# Patient Record
Sex: Male | Born: 1935 | Race: White | Hispanic: No | Smoking: Never smoker
Health system: Southern US, Community
[De-identification: ages and names within clinical notes are randomized; demographics above are authoritative.]

## PROBLEM LIST (undated history)

## (undated) DIAGNOSIS — I25810 Atherosclerosis of coronary artery bypass graft(s) without angina pectoris: Secondary | ICD-10-CM

## (undated) DIAGNOSIS — E039 Hypothyroidism, unspecified: Secondary | ICD-10-CM

## (undated) DIAGNOSIS — E7800 Pure hypercholesterolemia, unspecified: Secondary | ICD-10-CM

## (undated) DIAGNOSIS — I639 Cerebral infarction, unspecified: Secondary | ICD-10-CM

## (undated) HISTORY — DX: Pure hypercholesterolemia, unspecified: E78.00

## (undated) HISTORY — PX: APPENDECTOMY: SHX54

## (undated) HISTORY — PX: CORONARY ARTERY BYPASS GRAFT: SHX141

## (undated) HISTORY — DX: Atherosclerosis of coronary artery bypass graft(s) without angina pectoris: I25.810

## (undated) HISTORY — DX: Hypothyroidism, unspecified: E03.9

## (undated) HISTORY — PX: CORONARY ANGIOPLASTY WITH STENT PLACEMENT: SHX49

---

## 1998-05-01 ENCOUNTER — Inpatient Hospital Stay (HOSPITAL_COMMUNITY): Admission: EM | Admit: 1998-05-01 | Discharge: 1998-05-05 | Payer: Self-pay | Admitting: Emergency Medicine

## 1998-06-02 ENCOUNTER — Encounter (HOSPITAL_COMMUNITY): Admission: RE | Admit: 1998-06-02 | Discharge: 1998-08-31 | Payer: Self-pay | Admitting: Cardiology

## 2003-05-21 ENCOUNTER — Inpatient Hospital Stay (HOSPITAL_COMMUNITY): Admission: AD | Admit: 2003-05-21 | Discharge: 2003-05-28 | Payer: Self-pay | Admitting: Cardiology

## 2004-01-07 ENCOUNTER — Ambulatory Visit: Payer: Self-pay | Admitting: Cardiology

## 2004-01-14 ENCOUNTER — Ambulatory Visit: Payer: Self-pay | Admitting: Cardiology

## 2004-01-29 ENCOUNTER — Ambulatory Visit: Payer: Self-pay | Admitting: Cardiology

## 2004-07-30 ENCOUNTER — Ambulatory Visit: Payer: Self-pay | Admitting: Cardiology

## 2004-09-16 ENCOUNTER — Ambulatory Visit: Payer: Self-pay | Admitting: Cardiology

## 2005-01-03 ENCOUNTER — Ambulatory Visit: Payer: Self-pay | Admitting: Internal Medicine

## 2005-01-26 ENCOUNTER — Ambulatory Visit: Payer: Self-pay | Admitting: Cardiology

## 2005-07-22 ENCOUNTER — Ambulatory Visit: Payer: Self-pay | Admitting: Cardiology

## 2006-01-18 ENCOUNTER — Ambulatory Visit: Payer: Self-pay | Admitting: Cardiology

## 2006-06-30 ENCOUNTER — Ambulatory Visit: Payer: Self-pay | Admitting: Cardiology

## 2006-06-30 LAB — CONVERTED CEMR LAB: TSH: 2.33 microintl units/mL (ref 0.35–5.50)

## 2007-01-12 ENCOUNTER — Ambulatory Visit: Payer: Self-pay | Admitting: Cardiology

## 2007-01-12 LAB — CONVERTED CEMR LAB
Albumin: 4.1 g/dL (ref 3.5–5.2)
Cholesterol: 130 mg/dL (ref 0–200)
LDL Cholesterol: 72 mg/dL (ref 0–99)
Total Bilirubin: 0.9 mg/dL (ref 0.3–1.2)
Total CHOL/HDL Ratio: 4.5
Total Protein: 7.1 g/dL (ref 6.0–8.3)
Triglycerides: 147 mg/dL (ref 0–149)

## 2007-07-30 ENCOUNTER — Ambulatory Visit: Payer: Self-pay | Admitting: Cardiology

## 2008-01-31 ENCOUNTER — Ambulatory Visit: Payer: Self-pay | Admitting: Cardiology

## 2008-02-26 ENCOUNTER — Ambulatory Visit: Payer: Self-pay | Admitting: Cardiology

## 2008-02-26 LAB — CONVERTED CEMR LAB
ALT: 22 units/L (ref 0–53)
AST: 25 units/L (ref 0–37)
Albumin: 3.9 g/dL (ref 3.5–5.2)
Alkaline Phosphatase: 60 units/L (ref 39–117)
Cholesterol: 131 mg/dL (ref 0–200)
Total Protein: 6.7 g/dL (ref 6.0–8.3)
Triglycerides: 140 mg/dL (ref 0–149)

## 2008-06-12 ENCOUNTER — Encounter (INDEPENDENT_AMBULATORY_CARE_PROVIDER_SITE_OTHER): Payer: Self-pay | Admitting: *Deleted

## 2008-07-31 DIAGNOSIS — E7800 Pure hypercholesterolemia, unspecified: Secondary | ICD-10-CM | POA: Insufficient documentation

## 2008-07-31 DIAGNOSIS — E039 Hypothyroidism, unspecified: Secondary | ICD-10-CM | POA: Insufficient documentation

## 2008-07-31 DIAGNOSIS — I25810 Atherosclerosis of coronary artery bypass graft(s) without angina pectoris: Secondary | ICD-10-CM

## 2008-08-01 ENCOUNTER — Ambulatory Visit: Payer: Self-pay | Admitting: Cardiology

## 2008-08-01 DIAGNOSIS — I440 Atrioventricular block, first degree: Secondary | ICD-10-CM

## 2008-10-21 ENCOUNTER — Encounter: Admission: RE | Admit: 2008-10-21 | Discharge: 2008-10-21 | Payer: Self-pay | Admitting: Internal Medicine

## 2009-01-27 ENCOUNTER — Encounter (INDEPENDENT_AMBULATORY_CARE_PROVIDER_SITE_OTHER): Payer: Self-pay | Admitting: *Deleted

## 2009-01-28 ENCOUNTER — Ambulatory Visit: Payer: Self-pay | Admitting: Cardiology

## 2009-07-29 ENCOUNTER — Ambulatory Visit: Payer: Self-pay | Admitting: Cardiology

## 2009-09-24 ENCOUNTER — Telehealth: Payer: Self-pay | Admitting: Cardiology

## 2009-11-03 ENCOUNTER — Telehealth (INDEPENDENT_AMBULATORY_CARE_PROVIDER_SITE_OTHER): Payer: Self-pay | Admitting: *Deleted

## 2010-02-01 ENCOUNTER — Encounter: Payer: Self-pay | Admitting: Cardiology

## 2010-02-01 ENCOUNTER — Ambulatory Visit: Payer: Self-pay | Admitting: Cardiology

## 2010-04-01 NOTE — Progress Notes (Signed)
Summary: Pt having cataract eye surgery need to stop aspirin  Phone Note Call from Patient Call back at [phone]   Caller: Daughter/Sandra Summary of Call: Pt having cataract surgery on 10/07/09 and need to stop Aspirin his week before surgery Initial call taken by: Judie Grieve,  September 24, 2009 3:45 PM  Follow-up for Phone Call        I spoke with Dr Riley Kill and the pt can hold his ASA 1 week  prior to cataract surgery.  The pt's daughter is aware.  Follow-up by: Julieta Gutting, RN, BSN,  September 24, 2009 7:04 PM

## 2010-04-01 NOTE — Assessment & Plan Note (Signed)
Summary: f68m   Visit Type:  6 months follow up Primary Provider:  Timothy Lasso  CC:  No complains.  History of Present Illness: Denies chest pain.  Stillgets out in the morning and sees his friend.  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  Denies any chest pain with exertion.   Current Medications (verified): 1)  Pravastatin Sodium 40 Mg Tabs (Pravastatin Sodium) .... Take 1 Tablet By Mouth Two Times A Day At Bed Time 2)  Aspirin 81 Mg Tbec (Aspirin) .... Take One Tablet By Mouth Daily 3)  Synthroid 75 Mcg Tabs (Levothyroxine Sodium) .... Take 1 Tablet By Mouth Once A Day 4)  Nitroglycerin 0.4 Mg Subl (Nitroglycerin) .... One Tablet Under Tongue Every 5 Minutes As Needed For Chest Pain---May Repeat Times Three 5)  Multivitamins  Tabs (Multiple Vitamin) .... Take 1 Tablet By Mouth Once A Day 6)  Namenda 5 Mg Tabs (Memantine Hcl) .... Take 1 Tablet By Mouth Once A Day  Allergies (verified): No Known Drug Allergies  Vital Signs:  Patient profile:   77 year old male Height:      72 inches Weight:      206 pounds BMI:     28.04 Pulse rate:   57 / minute Pulse rhythm:   regular Resp:     18 per minute BP sitting:   109 / 67  (left arm) Cuff size:   large  Vitals Entered By: Vikki Ports (July 29, 2009 3:52 PM)  Physical Exam  General:  Well developed, well nourished, in no acute distress. Head:  normocephalic and atraumatic Eyes:  PERRLA/EOM intact; conjunctiva and lids normal. Lungs:  Clear bilaterally to auscultation and percussion. Heart:  PMI non displaced.  Normal S1 and S2.  No definite murmur.  Abdomen:  Bowel sounds positive; abdomen soft and non-tender without masses, organomegaly, or hernias noted. No hepatosplenomegaly. Extremities:  No clubbing or cyanosis. Neurologic:  Alert and oriented x 3.   EKG  Procedure date:  07/29/2009  Findings:      NSR.  FIrst degree AV block.  PR  272  Impression & Recommendations:  Problem # 1:  CAD, ARTERY BYPASS GRAFT (ICD-414.04) Remains  stable from a cardiac standpoint.  No definite symptoms.  Able to do most things.  Encourage activity.  His updated medication list for this problem includes:    Aspirin 81 Mg Tbec (Aspirin) .Marland Kitchen... Take one tablet by mouth daily    Nitroglycerin 0.4 Mg Subl (Nitroglycerin) ..... One tablet under tongue every 5 minutes as needed for chest pain---may repeat times three  Orders: EKG w/ Interpretation (93000)  Problem # 2:  HYPERCHOLESTEROLEMIA (ICD-272.0) Managed by Dr. Timothy Lasso.  His updated medication list for this problem includes:    Pravastatin Sodium 40 Mg Tabs (Pravastatin sodium) .Marland Kitchen... Take 1 tablet by mouth two times a day at bed time  Problem # 3:  AV BLOCK, 1ST DEGREE (ICD-426.11) continuing to monitor at this point in time.   His updated medication list for this problem includes:    Aspirin 81 Mg Tbec (Aspirin) .Marland Kitchen... Take one tablet by mouth daily    Nitroglycerin 0.4 Mg Subl (Nitroglycerin) ..... One tablet under tongue every 5 minutes as needed for chest pain---may repeat times three  Orders: EKG w/ Interpretation (93000)  Problem # 4:  HYPOTHYROIDISM (ICD-244.9) Followed by Dr. Timothy Lasso. His updated medication list for this problem includes:    Synthroid 75 Mcg Tabs (Levothyroxine sodium) .Marland Kitchen... Take 1 tablet by mouth once a day  Patient Instructions: 1)  Your physician recommends that you continue on your current medications as directed. Please refer to the Current Medication list given to you today. 2)  Your physician wants you to follow-up in:  6 MONTHS. You will receive a reminder letter in the mail two months in advance. If you don't receive a letter, please call our office to schedule the follow-up appointment.

## 2010-04-01 NOTE — Progress Notes (Signed)
Summary: refill meds  Phone Note Call from Patient Call back at Home Phone [phone] Message from:  Patient on November 03, 2009 4:16 PM  Refills Requested: Medication #1:  NITROGLYCERIN 0.4 MG SUBL One tablet under tongue every 5 minutes as needed for chest pain---may repeat times three [facility] [address] .   Method Requested: Fax to Local Pharmacy Caller: Daughter- sandra budd Reason for Call: Talk to Nurse Initial call taken by: Lorne Skeens,  November 03, 2009 4:16 PM  Follow-up for Phone Call        Rx faxed to pharmacy. Vikki Ports  November 04, 2009 10:58 AM     Prescriptions: NITROGLYCERIN 0.4 MG SUBL (NITROGLYCERIN) One tablet under tongue every 5 minutes as needed for chest pain---may repeat times three  #25 x 2   Entered by:   Vikki Ports   Authorized by:   Ronaldo Miyamoto, MD, [facility]   Signed by:   Vikki Ports on 11/04/2009   Method used:   Faxed to ...       [facility]. [phone]* (retail)       [address]       [place], [place]  [address]       Ph: [phone]       Fax: [phone]   RxID:   [id number]

## 2010-04-01 NOTE — Assessment & Plan Note (Signed)
Summary: f34m   Visit Type:  6 months follow up Primary Provider:  Timothy Lasso  CC:  Some Sob.  History of Present Illness: Overall doing well.  Denies major symptoms.  For 77 yo, he feels quality of life is good.  He goes most days to [place] to meet with friends.  Has lived longer than anyone in his family.   No chest pain.  Does not do enough at present.  Admits to not walking or exercising on a regular basis.  I encouraged him to do so today.   Problems Prior to Update: 1)  Av Block, 1st Degree  (ICD-426.11) 2)  Cad, Artery Bypass Graft  (ICD-414.04) 3)  Hypercholesterolemia  (ICD-272.0) 4)  Hypothyroidism  (ICD-244.9)  Current Medications (verified): 1)  Pravastatin Sodium 80 Mg Tabs (Pravastatin Sodium) .... Take One Tablet By Mouth Daily At Bedtime 2)  Aspirin 81 Mg Tbec (Aspirin) .... Take One Tablet By Mouth Daily 3)  Synthroid 75 Mcg Tabs (Levothyroxine Sodium) .... Take 1 Tablet By Mouth Once A Day 4)  Nitroglycerin 0.4 Mg Subl (Nitroglycerin) .... One Tablet Under Tongue Every 5 Minutes As Needed For Chest Pain---May Repeat Times Three 5)  Multivitamins  Tabs (Multiple Vitamin) .... Take 1 Tablet By Mouth Once A Day 6)  Namenda 10 Mg Tabs (Memantine Hcl) .... Take 1 Tablet By Mouth Once A Day  Allergies (verified): No Known Drug Allergies  Vital Signs:  Patient profile:   77 year old male Height:      72 inches Weight:      205.50 pounds BMI:     27.97 Pulse rate:   64 / minute Pulse rhythm:   irregular Resp:     18 per minute BP sitting:   110 / 60  (left arm) Cuff size:   large  Vitals Entered By: Vikki Ports (February 01, 2010 10:40 AM)  Physical Exam  General:  Well developed, well nourished, in no acute distress. Head:  normocephalic and atraumatic Eyes:  PERRLA/EOM intact; conjunctiva and lids normal. Lungs:  Clear bilaterally to auscultation and percussion. Heart:  Non displaced PMI.  Normal S1 and S2.  No rub, or gallop.  Minimal SEM.  No DM.    Abdomen:  Bowel sounds positive; abdomen soft and non-tender without masses, organomegaly, or hernias noted. No hepatosplenomegaly. Pulses:  pulses normal in all 4 extremities Extremities:  No clubbing or cyanosis. Neurologic:  Alert and oriented x 3.   EKG  Procedure date:  02/01/2010  Findings:      NSR with first degree av block.  Non specific ST abnormality.  Impression & Recommendations:  Problem # 1:  CAD, ARTERY BYPASS GRAFT (ICD-414.04) Perfectly stable at present.   His updated medication list for this problem includes:    Aspirin 81 Mg Tbec (Aspirin) .Marland Kitchen... Take one tablet by mouth daily    Nitroglycerin 0.4 Mg Subl (Nitroglycerin) ..... One tablet under tongue every 5 minutes as needed for chest pain---may repeat times three  Orders: EKG w/ Interpretation (93000)  Problem # 2:  HYPERCHOLESTEROLEMIA (ICD-272.0) Stable at present.  Has labs with Dr. Timothy Lasso on regular basis. His updated medication list for this problem includes:    Pravastatin Sodium 80 Mg Tabs (Pravastatin sodium) .Marland Kitchen... Take one tablet by mouth daily at bedtime  Problem # 3:  AV BLOCK, 1ST DEGREE (ICD-426.11) will need followup to monitor change.  His updated medication list for this problem includes:    Aspirin 81 Mg Tbec (Aspirin) .Marland Kitchen... Take  one tablet by mouth daily    Nitroglycerin 0.4 Mg Subl (Nitroglycerin) ..... One tablet under tongue every 5 minutes as needed for chest pain---may repeat times three  Patient Instructions: 1)  Your physician recommends that you schedule a follow-up appointment in: 6 months 2)  Your physician recommends that you continue on your current medications as directed. Please refer to the Current Medication list given to you today.

## 2010-06-09 ENCOUNTER — Other Ambulatory Visit: Payer: Self-pay | Admitting: Cardiology

## 2010-07-13 NOTE — Assessment & Plan Note (Signed)
[facility]                            CARDIOLOGY OFFICE NOTE   Vicky, Mccanless AVENIR LOZINSKI                          MRN:          [id number]  DATE:07/30/2007                            DOB:          March 09, 1921    Mr. Philip Herrera is in for followup.  In general he is doing pretty well.  [name] continues to do poorly, and this has occupied a bit of his time  obviously.  He denies any chest pain or progressive shortness of breath.   CURRENT MEDICATIONS:  1. Enteric coated aspirin 81 mg daily.  2. Multivitamin daily.  3. Pravachol 80 mg night.  4. Synthroid 0.05 mg daily.   On physical, he is alert and oriented, in no distress.  Blood pressure  is 110/64 and the pulse is 58.  The lungs fields are clear.  The cardiac rhythm is regular without a  significant murmur noted.  EXTREMITIES:  Reveal no definite edema.   The electrocardiogram demonstrates normal sinus rhythm with a first  degree AV block with a PR interval of 234 milliseconds.   IMPRESSION:  1. Coronary artery disease status post coronary artery bypass graft      surgery.  2. Hypercholesterolemia with relatively good numbers on Pravachol 80.  3  Hypothyroidism on replacement with last Synthroid being normal.  Of  note, apparently he intermittently takes this medication, and this has  been followed up with Dr. Timothy Lasso.   PLAN:  1. Return to clinic in six months.  2. Continue current medical regime.     Philip Herrera. Riley Kill, MD, [facility]  Electronically Signed    TDS/MedQ  DD: 07/30/2007  DT: 07/30/2007  Job #: [id number]   cc:   Gwen Pounds, MD

## 2010-07-13 NOTE — Assessment & Plan Note (Signed)
[facility]                            CARDIOLOGY OFFICE NOTE   Bence, Trapp ANASTACIO BUA                          MRN:          [id number]  DATE:01/12/2007                            DOB:          1922-01-22    Mr. Shorten is in for follow up. He has been struggling somewhat with his  wife. His wife has generally been fairly sick and increasingly appears  to have dementia. She has also probably had a recent stroke. He does get  a little bit light headed when he does stand up. Otherwise, he has  gotten along reasonably well.   CURRENT MEDICATIONS:  1. Enteric coated aspirin 81 mg daily.  2. Multivitamin daily.  3. Pravachol 80 mg nightly.  4. Synthroid 0.05 mg daily.   TSH done in May of this year was 2.58. Lipid profile reveals normal  liver functions. The total cholesterol is 130 with an HDL of 28 and an  LDL of 72.   PHYSICAL EXAMINATION:  VITAL SIGNS:  Blood pressure 110/70 supine and  then 110/70 sitting. The pulse was 68 and regular.  LUNGS:  Fields were really quite clear.  CARDIAC:  There is an S4 gallop. The cardiac rhythm is otherwise regular  without rub.  ABDOMEN:  Soft.   IMPRESSION:  1. Coronary artery disease status post coronary artery bypass graft      surgery.  2. Mild bradycardia.  3. Hypercholesterolemia on lipid lowering therapy.  4. Hypothyroidism on replacement therapy.   PLAN:  1. Return to clinic in six months.  2. Continue current medical regimen.  3. Continue general follow up with Dr. Timothy Lasso.     Arturo Morton. Riley Kill, MD, [facility]  Electronically Signed    TDS/MedQ  DD: 01/19/2007  DT: 01/20/2007  Job #: [id number]

## 2010-07-13 NOTE — Assessment & Plan Note (Signed)
[facility]                            CARDIOLOGY OFFICE NOTE   NAME:Herrera, Philip THWAITES                          MRN:          [id number]  DATE:01/31/2008                            DOB:          [birth year]    Philip Herrera is in for followup from a clinical standpoint.  He is stable.  He has not been having any ongoing chest pain.  He feels well.  He is  now on Aricept and the doses recently had been increased.  He has not  had presyncope or syncope.   MEDICATIONS:  1. Enteric-coated aspirin 81 mg daily.  2. Multivitamin daily.  3. Pravachol 80 mg nightly.  4. Synthroid 75 mcg daily.  5. Aricept 10 mg nightly.   PHYSICAL EXAMINATION:  VITAL SIGNS:  Blood pressure is 140/64 and pulse  59.  LUNGS:  Lung fields are clear.  CARDIAC:  Rhythm is regular.   EKG reveals sinus bradycardia with first-degree AV block, PR interval  234 milliseconds.   IMPRESSION:  1. Well-known coronary artery disease status post coronary artery      bypass graft surgery.  2. Hypercholesterolemia.  3. Lipid-lowering therapy  4. Recent increase in Aricept.   RECOMMENDATIONS:  1. Continue followup with Dr. Timothy Lasso.  2. Followup Cardiology in 6 months.  3. The patient's bradycardia will need to be monitored in light of      increasing Aricept.     Arturo Morton. Riley Kill, MD, [facility]  Electronically Signed    TDS/MedQ  DD: 01/31/2008  DT: 02/01/2008  Job #: [id number]   cc:   Gwen Pounds, MD

## 2010-07-16 NOTE — Op Note (Signed)
NAME:  Philip Herrera, Philip Herrera NO.:  [id number]   MEDICAL RECORD NO.:  [id number]                   PATIENT TYPE:  INP   LOCATION:  2306                                 FACILITY:  [facility]   PHYSICIAN:  Mikey Bussing, M.D.           DATE OF BIRTH:  06-Jul-1921   DATE OF PROCEDURE:  DATE OF DISCHARGE:                                 OPERATIVE REPORT   NO DICTATION FOR THIS JOB.                                               Mikey Bussing, M.D.    PV/MEDQ  D:  05/22/2003  T:  05/23/2003  Job:  [id number]

## 2010-07-16 NOTE — Cardiovascular Report (Signed)
NAME:  Philip Herrera, Philip Herrera NO.:  [id number]   MEDICAL RECORD NO.:  [id number]                   PATIENT TYPE:  INP   LOCATION:  2306                                 FACILITY:  [facility]   PHYSICIAN:  Mikey Bussing, M.D.           DATE OF BIRTH:  Aug 19, 1921   DATE OF PROCEDURE:  DATE OF DISCHARGE:                              CARDIAC CATHETERIZATION   Audio too short to transcribe (less than 5 seconds)                                               Mikey Bussing, M.D.    PV/MEDQ  D:  05/22/2003  T:  05/22/2003  Job:  [id number]

## 2010-07-16 NOTE — Letter (Signed)
January 18, 2006    Janae Bridgeman. Lendell Caprice, M.D.  [address]  [place],  [place] [address]   RE:  Philip Herrera, Philip Herrera  MRN:  [id number]  /  DOB:  December 31, 1921   Dear Rosalia Hammers:   I had the pleasure of seeing Philip Herrera in the office today at a follow-  up visit. Overall, he physically looks okay. His daughter says that he  has been somewhat fatigued and a little bit short of breath, although he  has been doing absolutely no exercise. He has been under a lot of  stress, particularly with his daughter. He also takes care of his fairly  invalid wife, and she is also a patient of yours. They do have some help  for about 3 hours in the morning. The patient denies any ongoing chest  pain or major cardiac complaints other than his shortness of breath.   On physical today, the weight is 204 pounds, blood pressure is 134/86,  pulse is 61. In general, he appears slightly pale. The lung fields are  clear to auscultation and percussion. There is a soft systolic ejection  murmur. Abdomen is soft. There are good pulses distally without any  evidence of significant peripheral vascular disease by exam.   The electrocardiogram demonstrates normal sinus rhythm with first-degree  AV block and otherwise normal tracing.   This very nice gentleman is stable. He has had prior revascularization  surgery in 2005.  He is currently on Pravachol 80 mg, and he needs to  have a lipid and liver profile. I think he also would benefit from some  laboratory studies, and it has been over a year since he has had a  follow-up physical with you. I would strongly encourage this and I have  asked him to do this so  that he can get these things checked. I would be happy to see him at any  time. I have encouraged him to walk on a regular basis, and if his  symptoms of mild shortness of breath do not improve with this then we  could reassess his overall condition. I appreciate the opportunity of  sharing in his care.    Sincerely,      Arturo Morton. Riley Kill, MD, [facility]  Electronically Signed    TDS/MedQ  DD: 01/18/2006  DT: 01/18/2006  Job #: [id number]

## 2010-07-16 NOTE — Discharge Summary (Signed)
NAME:  Philip Herrera NO.:  [id number]   MEDICAL RECORD NO.:  [id number]                   PATIENT TYPE:  INP   LOCATION:  2007                                 FACILITY:  [facility]   PHYSICIAN:  Kerin Perna, M.D.               DATE OF BIRTH:  [birth year]   DATE OF ADMISSION:  DATE OF DISCHARGE:  05/28/2003                                 DISCHARGE SUMMARY   ADMISSION DIAGNOSES:  Coronary artery disease with recurrent, substernal  chest pain.   PAST MEDICAL HISTORY:  Coronary artery disease, status post MI 1988 followed  by angioplasty, MI 2000, status post PTCA and stenting.  Hyperlipidemia.   PAST SURGICAL HISTORY:  Appendectomy.   ALLERGIES:  NO KNOWN DRUG ALLERGIES.   DISCHARGE DIAGNOSES:  Two vessel coronary artery disease with class 4  angina, status post coronary artery bypass graft.   BRIEF HISTORY:  Mr. Philip Herrera is a 77 year old Caucasian male.  He is a patient  of Dr. Rosalyn Charters who has been following him for several years.  He had been  doing well with medical management of his coronary disease but recently  developed recurrent, substernal chest pain.  Dr. Riley Kill recommended repeat  cardiac catheterization.  [name] agreed with this plan and was scheduled  for routine admission.   HOSPITAL COURSE:  On 05/20/03, Philip Herrera was admitted to [facility]  under the care of Dr. Shawnie Pons. He underwent a cardiac  catheterization.  This revealed 2 vessel coronary artery disease including  severe circumflex disease with long lesions.  As his lesions were not  amenable to further PCI, cardiac surgery consultation was requested. He was  evaluated later in the day by Dr. Kathlee Nations Trigt.  After examination of the  patient and review of the old records, Dr. Donata Clay agreed that proceeding  with coronary artery bypass grafting was the appropriate course of treatment  for this gentleman.  The procedure, risks and benefits were all discussed  with Mr. Needle.  He agreed to proceed with surgery.   Preoperative arterial evaluation performed on March 23rd included a carotid  duplex exam which revealed no significant coronary artery disease.  His  upper extremities pain included bilateral Allen's tests; these were normal.  His lower extremities exam revealed palpable pedal pulses bilaterally.   On May 22, 2003, Philip Herrera underwent the following surgical procedure with  Dr. Kathlee Nations Trigt:  Coronary artery bypass grafting x4.  Grafts placed at  the time of procedure:  Left internal mammary artery grafted to the left  anterior descending artery, saphenous vein graft to the diagonal artery,  saphenous vein graft to the circumflex artery, saphenous vein graft to the  obtuse marginal artery. Vein was harvested from the right thigh via the Endo-  vein harvesting technique, through the mid-calf with an open surgical  technique.  [name] tolerated his  procedure well, transferring in stable  condition to the SICU.   He remained hemodynamically stable in the immediate postoperative period. He  was maintained in normal sinus rhythm on IV amiodarone.  He was extubated  several hours after arrival in the intensive care unit and awoke from  anesthesia, neurologically intact.  His stay in the intensive care unit was  uneventful. He was transferred out to unit 2000 on postoperative day #2.  His amiodarone was changed to p.o. on postoperative day #1.   Philip Herrera has continued to make very good progress in recovering from his  surgery.  His course has remained uneventful.  This morning, May 28, 2003,  postoperative day  #6, his vital signs were stable, blood pressure 125/67, afebrile, room air  saturation 93%.  His heart is maintained in normal sinus rhythm, 68 beats  per minute. Lungs were clear to auscultation.  He was tolerating his diet,  his bowel and bladder functions were within normal limits for him.  His  incisions were all healing  well. He has no lower extremities edema.  He is  ambulating frequently in the hallway.  His pain is well-controlled.  He did  have some hypokalemia yesterday. This was supplemented this morning with  potassium of 3.5. He will be given additional dose of potassium today.  He  is finished with his course of Lasix.  Philip Herrera is ready for discharge home  today.  His discharge instructions were reviewed with him.   Home health services have been arranged with [facility] for  registered nurse assistance.   LABORATORY DATA:  On March 27: CBC:  WBC 13.8, hemoglobin 9.6, hematocrit  28.1, platelets 151.  Chemistries on  March 30:  Sodium 138, potassium 3.5,  BUN 19, creatinine 1.1, glucose 110.   CONDITION ON DISCHARGE:  Improved.   DISCHARGE INSTRUCTIONS:   DISCHARGE MEDICATIONS:  1. He has been instructed to resume his home medications of Pravachol 40 mg     q.h.s., continue aspirin at new dose of 325 mg daily.  New medications:  1. Toprol  XL 25 mg daily.  2. Amiodarone 200 mg twice daily.  (It is expected he will not need     amiodarone long-term).  3. For pain management, he will have Ultram 50 mg 1-2 p.o. q.4h. p.r.n. for     moderate to severe pain or Tylenol 325 mg 1-2 p.o. q.4h. p.r.n. for mild     pain.   ACTIVITY:  He was asked to refrain from any driving or any heavy lifting  until next appointment. Also instructed to continue his breathing exercises  and daily walking.   WOUND CARE:  He is to shower daily.  If his incision shows any sign of  infection or fever greater than 100.1 degrees Fahrenheit, he is to call Dr.  Zenaida Niece Trigt's office.   FOLLOW UP:  Appointment with Dr. Riley Kill was to be arranged prior to  discharge today. He should be seen in Dr Rosalyn Charters office in approximately 2  weeks with a chest x-ray.  [name] has an appointment at the [facility] office to  see Dr. Dorris Fetch on Monday, April 25th at 11 a.m.     Toribio Harbour, N.P.                   Kerin Perna, M.D.    CTK/MEDQ  D:  05/28/2003  T:  05/29/2003  Job:  [id number]   cc:   Maisie Fus  Cheri Kearns, M.D. [facility]   Raymond C. Eloise Harman., M.D.  [address]  [place]  [place] [address]  Fax: [phone]

## 2010-07-16 NOTE — Cardiovascular Report (Signed)
NAME:  Philip Herrera, Philip Herrera                             ACCOUNT NO.:  [id number]   MEDICAL RECORD NO.:  [id number]                   PATIENT TYPE:  INP   LOCATION:  2399                                 FACILITY:  [facility]   PHYSICIAN:  Arturo Morton. Riley Kill, M.D. [facility]         DATE OF BIRTH:  1921/11/29   DATE OF PROCEDURE:  05/20/2003  DATE OF DISCHARGE:                              CARDIAC CATHETERIZATION   INDICATIONS:  Mr. Maneri is a delightful 77 year old gentleman who has had  known coronary disease.  He had an acute MI angioplasty in 1988 of the  circumflex.  He subsequently underwent stenting of the right coronary artery  in the setting of a myocardial infarction in 2000.  He now presents with  recurrent angina and a positive Cardiolite study.  Risks, benefits and  alternatives were discussed with the patient in detail and he was agreeable  to proceed.   PROCEDURES:  1. Left heart catheterization.  2. Selective coronary arteriography.  3. Selective left ventriculography.  4. Subclavian angiography.   DESCRIPTION OF PROCEDURE:  The patient was brought to the catheterization  lab and prepped and draped in the usual fashion.  Through an anterior  puncture, the right femoral artery was easily entered.  Views of the left  and right coronary arteries were obtained in multiple angiographic  projections, ventriculographies were performed in the RAO projection.  Using  the right coronary catheter the subclavian was injected.  All catheters were  subsequently removed.  He was taken to the holding area in satisfactory,  clinical condition.   HEMODYNAMIC DATA:  1. Central aortic pressure:  142/78 with a mean of 107.  2. Left ventricular pressure:  142/12.  3. No gradient on pullback across the aortic valve.   ANGIOGRAPHIC DATA:  1. Ventriculography was performed in the RAO projection.  There was some     hypokinesis of the inferobasal segment.  Overall ejection fraction,     however, appeared to  exceed 55%.  2. There is 30% narrowing in the proximal right coronary artery.  After this     there is a long stent with less than 20 to 30% narrowing throughout the     stent with a widely patent vessel.  There is a small posterolateral     branch that has about a 90% area of focal stenosis, but this is     relatively small.  3. The left coronary artery is moderately calcified.  4. The left main coronary is without critical narrowing.  5. The left anterior descending artery is moderately calcified and there is     about 70 to 75% focal narrowing in the proximal vessel.  There is a first     diagonal that has about 90% narrowing and a second diagonal with about     70%.  Between the origins of the diagonal was a 40 to 50% area of focal  narrowing as well.  The distal LAD wraps the apex.  6. The circumflex has about 40% ostial narrowing and then breaks into a     large marginal branch that bifurcates.  This marginal branch has 95%     segmental narrowing in its mid portion.  The A-V circumflex supplies two     posterolateral branches and there is 70 to 80% narrowing just after its     takeoff from the first marginal and then a 90% area of narrowing leading     into the first posterolateral branch.  7. The subclavian is widely patent as is the internal mammary.   CONCLUSIONS:  1. Preserved overall left ventricular function.  2. Patent internal mammary.  3. Continued patency of the stent to the right coronary.  4. High grade disease of both the circumflex marginal and the A-V     circumflex.  5. Moderately high grade disease of the left anterior descending artery.   DISPOSITION:  The patient has advanced age.  He has a number of options.  The best option is unclear.  I will sit and discuss the options with him in  great detail prior to making a final decision.  He would be a candidate from  the standpoint of revascularization surgery, but he may choose not to do  this.  We could  dilate the circumflex and place a stent in this vessel and  leave the A-V circumflex without intervention or an attempt could be made to  open this as well.  I will discuss these options with him in detail.                                               Arturo Morton. Riley Kill, M.D. [facility]    TDS/MEDQ  D:  05/20/2003  T:  05/22/2003  Job:  [id number]   cc:   Arturo Morton. Riley Kill, M.D. [facility]   CV Laboratory

## 2010-07-16 NOTE — Assessment & Plan Note (Signed)
[facility]                            CARDIOLOGY OFFICE NOTE   French, Kendra MELO STAUBER                          MRN:          [id number]  DATE:06/30/2006                            DOB:          1921-05-17    Mr. Leaf is in for a followup visit.  Cardiac-wise he is doing  reasonably well.  He is out 5 years from his revascularization surgery.  He is not having chest pain.  The biggest limitation is some orthostatic  dizziness.  When he stands up, he gets light-headed.  It rarely occurs a  lot.  He drinks a fair amount of coffee and does not drink much water.  His blood pressures have not been all that high.  He also has lost his  primary care doctor with Dr. Lendell Caprice retiring.  His daughter has seen  Dr. Creola Corn, and we have suggested he consider seeing Dr. Timothy Lasso for  his care.   MEDICATIONS:  1. Enteric-coated aspirin 81 mg daily.  2. Multivitamin daily.  3. Pravachol 80 mg nightly.  4. Synthroid 0.05 mg daily.   PHYSICAL:  The weight is 204 pounds, blood pressure 116/69, pulse 62.  LUNGS:  Fields are clear.  CARDIAC:  Rhythm is regular.  There is no murmur or gallop.  There are  no carotid bruits.  I did orthostatic blood pressures.  At rest, the  blood pressure was 120/70.  Standing, after several minutes it was also  120/70, so there was not a profound drop.   EKG reveals sinus rhythm with first degree AV block.   IMPRESSION:  1. Coronary artery disease status post coronary artery bypass grafting      surgery.  2. Hypothyroidism.  3. Hypertension.  4. Hypothyroidism.   PLAN:  1. Check TSH.  2. Encourage the patient to see Dr. Timothy Lasso.  3. Encourage increased water and perhaps slight salt intake.  4. Return to clinic in 6 months.     Arturo Morton. Riley Kill, MD, [facility]  Electronically Signed    TDS/MedQ  DD: 06/30/2006  DT: 06/30/2006  Job #: [id number]

## 2010-07-16 NOTE — Op Note (Signed)
NAME:  MALACHI, KINZLER                             ACCOUNT NO.:  [id number]   MEDICAL RECORD NO.:  [id number]                   PATIENT TYPE:  INP   LOCATION:  2306                                 FACILITY:  [facility]   PHYSICIAN:  Kerin Perna III, M.D.           DATE OF BIRTH:  01-Oct-1921   DATE OF PROCEDURE:  05/22/2003  DATE OF DISCHARGE:                                 OPERATIVE REPORT   OPERATION:  Coronary artery bypass grafting x 4 (left internal mammary  artery to the left anterior descending, saphenous vein graft to diagonal,  saphenous vein graft to first obtuse marginal, saphenous vein graft to  distal circumflex).   PREOPERATIVE DIAGNOSIS:  Class IV angina with unstable angina and three  vessel coronary disease.   POSTOPERATIVE DIAGNOSIS:  Class IV angina with unstable angina and three  vessel coronary disease.   SURGEON:  Kerin Perna, M.D.   ASSISTANT:  Toribio Harbour, N.P.   ANESTHESIA:  General.   INDICATIONS FOR PROCEDURE:  The patient is a 77 year old male with a history  of coronary disease status post stenting of the right coronary four years  ago.  He presented with symptoms of unstable angina and cardiac  catheterization demonstrated a high grade stenosis of his circumflex, high  grade stenosis of the LAD diagonal, and a patent stent in the proximal right  coronary.  His ejection fraction was 45%.  He was felt to be a candidate for  surgical revascularization based on his anatomy, symptoms, and a positive  stress test.   Prior to surgery, I examined the patient in his hospital room and reviewed  the results of the cardiac cath with the patient and family.  I discussed  the indications and expected benefits of coronary bypass surgery for  treatment of his coronary disease.  I discussed reviewed with the patient  the alternatives to surgical therapy and the expected outcome of those  alternative therapies.  I discussed with the patient and family the  major  aspects of the proposed procedure including the choices of conduits for  grafting, the location of the surgical incisions, the use of general  anesthesia and cardiopulmonary bypass, and the expected postoperative  hospital recovery.  I discussed with the patient the risks to him of  coronary bypass surgery including the risks of MI, CVA, bleeding, infection,  and death.  He understood these implications for the surgery and agreed to  proceed with the operation as planned under what I felt was an informed  consent.   OPERATIVE FINDINGS:  There was some mild to moderate scarring of the lateral  wall.  The saphenous vein graft was harvested from the right leg and was of  adequate quality.  Endoscopic technique was used.  The left IMA was a good  vessel with excellent flow.  The LAD was deeply intramyocardial and the  anastomosis was placed close  to the apex where the vessel was still a good  size vessel.  The patient had intramyocardial circumflex vessels, as well.   PROCEDURE:  The patient was brought to the operating room and placed supine  on the operating table.  General endotracheal anesthesia was induced under  invasive hemodynamic monitoring.  The chest, abdomen, and legs were prepped  with Betadine and draped as a sterile field.  A sternal incision was made as  the saphenous vein was harvested from the right leg using endoscopic  technique.  The left IMA was harvested as a pedicle graft at its origin of  the subclavian vessels.  It was a good vessel with excellent flow.  Heparin  was administered and the ACT was documented as being therapeutic.  The  sternal retractor was placed.  Purse-strings were placed in the ascending  aorta and right atrium and the patient was cannulated and placed on bypass.  The coronaries were identified for grafting.  The mammary artery and vein  grafts were prepared for the distal anastomoses.  A cardioplegia cannula was  placed in the ascending  aorta.  The patient was cooled to 30 degrees.  The  aortic Cross clamp was applied.  800 mL of cold blood cardioplegia was  delivered to the aortic root with good cardioplegic arrest and septal  temperature less than 12 degrees.  Topical iced saline slush was used to  augment myocardial preservation and a pericardial insufflator pad was used  to protect the left phrenic nerve.   The distal coronary anastomoses were then performed.  The first distal  anastomosis was to the diagonal.  This was a 1.5 mm vessel.  It had a  proximal 95% stenosis.  A reversed saphenous vein was sewn end-to-side with  running 7-0 Prolene with good flow through the graft.  The second distal  anastomosis was to the OM1.  This was a large 1.8 mm vessel with a proximal  99% stenosis.  A reversed saphenous vein was sewn end-to-side with running 7-  0 Prolene with good flow to the graft.  The cardioplegia was redosed.  The  third distal distal anastomosis was to the distal circumflex.  This was a  1.5 mm vessel with a proximal 95% stenosis.  A reversed saphenous vein was  sewn end-to-side with running 7-0 Prolene, there was good flow to the graft.  The fourth distal anastomosis was to the distal LAD.  This was  a 1.5 mm  vessel.  The left internal mammary artery pedicle was brought through an  opening created in the left lateral pericardium and was brought down onto  the LAD and sewn end-to-side with running 8-0 Prolene.  There was good flow  through the anastomosis with immediate rise in septal temperature after  release of the pedicle clamp of the mammary artery.  The mammary pedicle was  secured to the epicardium and the aortic Cross clamp was removed.   The heart was cardioverted back to a regular rhythm. Using a partial  occlusion clamp, three proximal vein anastomoses were placed on the aorta  using a 4.0 mm punch.  Running 6-0 Prolene was used for the vein grafts. The Cross clamp was removed and the vein grafts  were perfused.  Each had  good flow and hemostasis was documented at the proximal and distal sites.  The patient was rewarmed and reperfused.  Temporary pacing wires were  applied.  The lungs were expanded and the ventilator resumed.  The patient  was weaned from bypass without inotrops with stable blood pressure and  hemodynamics.  Protamine was administered without adverse reaction.  The  cannulae were removed.  The mediastinum was irrigated with warm antibiotic  irrigation.  The leg incisions were irrigated and closed in a standard  fashion.  The pericardium was loosely reapproximated.  Two mediastinal and a  left pleural chest tube were placed and brought out through separate  incisions.  The sternum was reapproximated with interrupted steel wire.  The  pectoralis fascia was closed with a running Vicryl.  The subcutaneous tissue  and skin layers were closed with running Vicryl and sterile dressings were  applied.  Total bypass time was 140 minutes.  The aortic Cross clamp time  was 60 minutes.                                               Mikey Bussing, M.D.    PV/MEDQ  D:  05/22/2003  T:  05/23/2003  Job:  [id number]

## 2010-07-16 NOTE — H&P (Signed)
NAME:  Philip Herrera, Philip Herrera                             ACCOUNT NO.:  [id number]   MEDICAL RECORD NO.:  [id number]                   PATIENT TYPE:  OIB   LOCATION:  2013                                 FACILITY:  [facility]   PHYSICIAN:  Arturo Morton. Riley Kill, M.D. [facility]         DATE OF BIRTH:  01-30-1922   DATE OF ADMISSION:  05/20/2003  DATE OF DISCHARGE:                                HISTORY & PHYSICAL   CHIEF COMPLAINT:  Chest tightness.   HISTORY OF PRESENT ILLNESS:  Philip Herrera is a 77 year old gentleman well known  to me.  He has had a prior acute MI angioplasty of the circumflex in 1988  with reduction in the stenosis from 100 to 20%.  He was treated medically at  that time and did well until 2000 at which time he presented with a  recurrent myocardial infarction.  The patient underwent a left heart  catheterization which revealed a 99% RCA with thrombus with 40% proximal  LAD, focal 60% LAD, 40% large circumflex, and EF of 55%.  He underwent PTCA  and stenting by Dr. Veneda Melter with ReoPro and Plavix.  He did well.  At  that time, his peak CK was 660 with an MB of 25, and an MB subsequently of  33 and 42.  He has continued to do well but recently developed recurrent  substernal chest pain.  The patient is brought back in for cardiac  catheterization.   The patient has no known drug allergies.   His current medications include:  1. Enteric-coated aspirin 81 mg daily.  2. Pravachol 40 mg q.h.s.   The patient underwent stress exercise radionuclide imaging at the [facility] on May 15, 2003.  This revealed a lateral defect.  He had  onset of chest tightness with exertion, and the test was terminated at  fatigue with a heart rate of 129.  There was ST segment depression of  approximately 2 mm compatible with a positive electrocardiographic  abnormality to stress.   The patient has had a prior appendectomy.   The patient is married.  His wife has been a patient of mine.  She has  been  increasingly incapacitated, and he is providing most of the care for her.  The patient does not smoke.   The patient's father had a myocardial infarction.   The review of systems is generally negative.   On physical examination:  GENERAL:  The patient is an alert oriented gentleman in no acute distress.  VITAL SIGNS:  Blood pressure is 111/68, pulse 63.  NECK:  No carotid bruits.  LUNGS:  Fields clear to auscultation and percussion.  CARDIAC:  Rhythm is regular.  There is a 1/6 systolic ejection murmur, a  soft S4 gallop.  ABDOMEN:  Soft without widened pulsation.  EXTREMITIES:  Femoral pulses are intact.  NEUROLOGIC:  Nonfocal.   Hemoglobin 15.2, hematocrit 45.6, MCV 96, platelet  count 251,000.  PTT 28,  pro-time 12.8, with an INR of 1.2, BUN of 16, creatinine 1.1, and potassium  4.0, glucose is 129.   The EKG reveals sinus bradycardia with first degree AV block at 214 ms P-R  interval.   Risks, benefits, and alternatives have been discussed with the patient in  detail, and at the present time, our plan is to proceed with a cardiac  catheterization.  He understands and accepts the risks of the procedure and  is agreeable to proceed.                                                Arturo Morton. Riley Kill, M.D. [facility]    TDS/MEDQ  D:  05/20/2003  T:  05/21/2003  Job:  [id number]

## 2010-07-23 ENCOUNTER — Encounter: Payer: Self-pay | Admitting: Cardiology

## 2010-08-04 ENCOUNTER — Ambulatory Visit (INDEPENDENT_AMBULATORY_CARE_PROVIDER_SITE_OTHER): Payer: Medicare Other | Admitting: Cardiology

## 2010-08-04 ENCOUNTER — Encounter: Payer: Self-pay | Admitting: Cardiology

## 2010-08-04 DIAGNOSIS — I440 Atrioventricular block, first degree: Secondary | ICD-10-CM

## 2010-08-04 DIAGNOSIS — I2510 Atherosclerotic heart disease of native coronary artery without angina pectoris: Secondary | ICD-10-CM

## 2010-08-04 DIAGNOSIS — E7800 Pure hypercholesterolemia, unspecified: Secondary | ICD-10-CM

## 2010-08-04 NOTE — Assessment & Plan Note (Signed)
Doing well.  PR slightly longer. We will see again in six months.  No symptoms

## 2010-08-04 NOTE — Progress Notes (Signed)
HPI:  Doing well.  No specific complaints.  No chest pain.  Sleeps a lot.  No specific problems   Current Outpatient Prescriptions  Medication Sig Dispense Refill  . aspirin 81 MG tablet Take 81 mg by mouth daily.        Marland Kitchen levothyroxine (SYNTHROID, LEVOTHROID) 75 MCG tablet Take 75 mcg by mouth daily.        . memantine (NAMENDA) 10 MG tablet Take 10 mg by mouth daily.        . Multiple Vitamin (MULTIVITAMIN) tablet Take 1 tablet by mouth daily.        . nitroGLYCERIN (NITROSTAT) 0.4 MG SL tablet Place 0.4 mg under the tongue every 5 (five) minutes as needed. Limit of 3 at a time.       . pravastatin (PRAVACHOL) 40 MG tablet take 2 tablets by mouth at bedtime  60 tablet  5    No Known Allergies  Past Medical History  Diagnosis Date  . Coronary atherosclerosis of artery bypass graft   . Coronary atherosclerosis of artery bypass graft   . Coronary atherosclerosis of artery bypass graft   . Coronary atherosclerosis of artery bypass graft   . Pure hypercholesterolemia   . Unspecified hypothyroidism     Past Surgical History  Procedure Date  . Coronary artery bypass graft   . Appendectomy   . Coronary angioplasty with stent placement     No family history on file.  History   Social History  . Marital Status: Married    Spouse Name: N/A    Number of Children: N/A  . Years of Education: N/A   Occupational History  . Not on file.   Social History Main Topics  . Smoking status: Never Smoker   . Smokeless tobacco: Not on file  . Alcohol Use: No  . Drug Use: No  . Sexually Active: Not on file   Other Topics Concern  . Not on file   Social History Narrative  . No narrative on file    ROS: Please see the HPI.  All other systems reviewed and negative.  PHYSICAL EXAM:  BP 142/68  Pulse 61  Ht 6' (1.829 m)  Wt 206 lb 12.8 oz (93.804 kg)  BMI 28.05 kg/m2  General: Well developed, well nourished, in no acute distress. Head:  Normocephalic and atraumatic. Neck: no  JVD Lungs: Clear to auscultation and percussion. Heart: Normal S1 and S2.  No murmur, rubs or gallops.  Abdomen:  Normal bowel sounds; soft; non tender; no organomegaly Pulses: Pulses normal in all 4 extremities. Extremities: No clubbing or cyanosis. No edema. Neurologic: Alert and oriented x 3.  EKG:  NSR.  First degree av block.  PR 296 ms.   ASSESSMENT AND PLAN:

## 2010-08-04 NOTE — Assessment & Plan Note (Signed)
Last LDL here was 2 years ago.  Sees Dr. Timothy Lasso regularly.  Will defer lipids to his office.

## 2010-08-04 NOTE — Assessment & Plan Note (Signed)
NO current symptoms.  Continue medical therapy.

## 2010-08-04 NOTE — Patient Instructions (Signed)
Your physician recommends that you schedule a follow-up appointment in: 6 months  

## 2010-11-27 ENCOUNTER — Other Ambulatory Visit: Payer: Self-pay | Admitting: Cardiology

## 2011-02-01 ENCOUNTER — Encounter: Payer: Self-pay | Admitting: Cardiology

## 2011-02-01 ENCOUNTER — Ambulatory Visit (INDEPENDENT_AMBULATORY_CARE_PROVIDER_SITE_OTHER): Payer: Medicare Other | Admitting: Cardiology

## 2011-02-01 DIAGNOSIS — I2510 Atherosclerotic heart disease of native coronary artery without angina pectoris: Secondary | ICD-10-CM

## 2011-02-01 DIAGNOSIS — E7800 Pure hypercholesterolemia, unspecified: Secondary | ICD-10-CM

## 2011-02-01 DIAGNOSIS — I440 Atrioventricular block, first degree: Secondary | ICD-10-CM

## 2011-02-01 NOTE — Assessment & Plan Note (Signed)
Continues to do well cardiac why.  No change in status.

## 2011-02-01 NOTE — Assessment & Plan Note (Signed)
Looks better at present.  PR is actually shorter.

## 2011-02-01 NOTE — Patient Instructions (Signed)
Your physician wants you to follow-up in: 6 MONTHS.  You will receive a reminder letter in the mail two months in advance. If you don't receive a letter, please call our office to schedule the follow-up appointment.  Your physician recommends that you continue on your current medications as directed. Please refer to the Current Medication list given to you today.  

## 2011-02-01 NOTE — Assessment & Plan Note (Signed)
Getting this done at Dr. Ferd Hibbs office.  Was near [place] when last checked here 3 years ago.

## 2011-02-01 NOTE — Progress Notes (Signed)
   HPI:  Other than sleeping a lot, he is doing great.  Gets up about 10 am.  Obvious that he still misses his wife.  Denies chest pain.    Current Outpatient Prescriptions  Medication Sig Dispense Refill  . aspirin 81 MG tablet Take 81 mg by mouth daily.        Marland Kitchen levothyroxine (SYNTHROID, LEVOTHROID) 75 MCG tablet Take 75 mcg by mouth daily.        . memantine (NAMENDA) 10 MG tablet Take 10 mg by mouth daily.        . Multiple Vitamin (MULTIVITAMIN) tablet Take 1 tablet by mouth daily.        . nitroGLYCERIN (NITROSTAT) 0.4 MG SL tablet Place 0.4 mg under the tongue every 5 (five) minutes as needed. Limit of 3 at a time.       . pravastatin (PRAVACHOL) 40 MG tablet take 2 tablets by mouth at bedtime  60 tablet  5    No Known Allergies  Past Medical History  Diagnosis Date  . Coronary atherosclerosis of artery bypass graft   . Coronary atherosclerosis of artery bypass graft   . Coronary atherosclerosis of artery bypass graft   . Coronary atherosclerosis of artery bypass graft   . Pure hypercholesterolemia   . Unspecified hypothyroidism     Past Surgical History  Procedure Date  . Coronary artery bypass graft   . Appendectomy   . Coronary angioplasty with stent placement     No family history on file.  History   Social History  . Marital Status: Married    Spouse Name: N/A    Number of Children: N/A  . Years of Education: N/A   Occupational History  . Not on file.   Social History Main Topics  . Smoking status: Never Smoker   . Smokeless tobacco: Not on file  . Alcohol Use: No  . Drug Use: No  . Sexually Active: Not on file   Other Topics Concern  . Not on file   Social History Narrative  . No narrative on file    ROS: Please see the HPI.  All other systems reviewed and negative.  PHYSICAL EXAM:  BP 124/58  Pulse 65  Ht 6' (1.829 m)  Wt 92.443 kg (203 lb 12.8 oz)  BMI 27.64 kg/m2  General: Well developed, well nourished, in no acute  distress. Head:  Normocephalic and atraumatic. Neck: no JVD Lungs: Clear to auscultation and percussion. Heart: Normal S1 and S2.  No murmur, rubs or gallops.  Abdomen:  Normal bowel sounds; soft; non tender; no organomegaly Pulses: Pulses normal in all 4 extremities. Extremities: No clubbing or cyanosis. No edema. Neurologic: Alert and oriented x 3.  EKG:  NSR with first degree AV block  (PR ).  This is better than the last tracing.  ASSESSMENT AND PLAN:

## 2011-02-02 IMAGING — CT CT HEAD W/O CM
1 series · 16 of 30 positions shown, 20 images · non-contrast
Comparison: None.

CLINICAL DATA: 86-year-old male with tremors, dizziness, recent
fall.  Question stroke.

CT HEAD WITHOUT CONTRAST
TECHNIQUE: Contiguous axial images were obtained from the base of
the skull through the vertex without contrast.

[Series 2: head · axial · 0.49mm/px · z∈[+16,+167]mm · 16 of 32 slices shown, 20 images]
[im 2/32  brain]
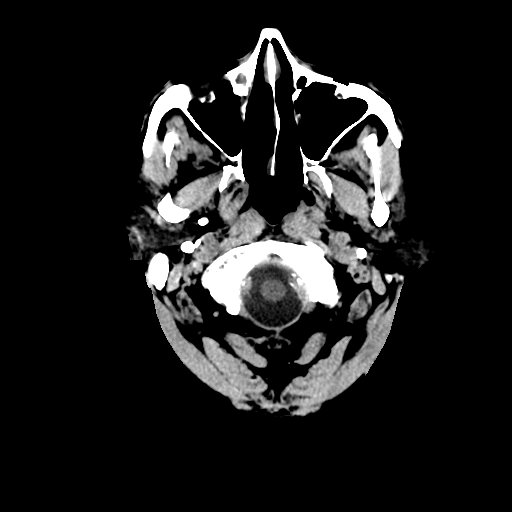
[im 2/32  bone]
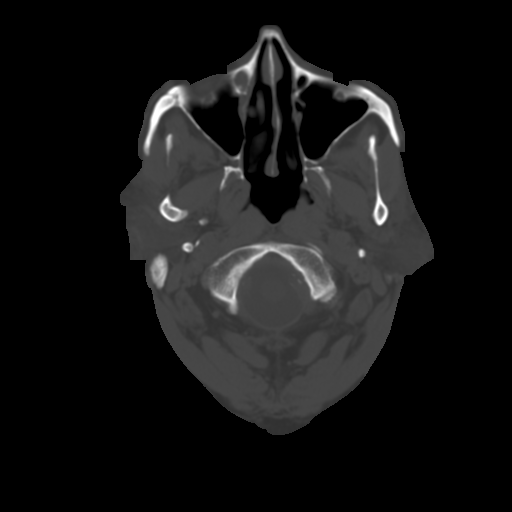
[im 4/32  brain]
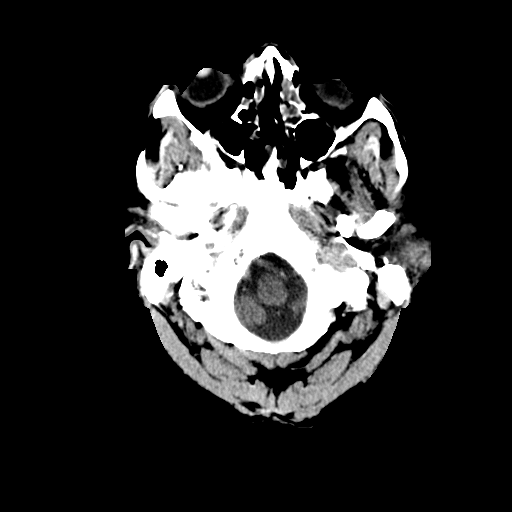
[im 6/32  brain]
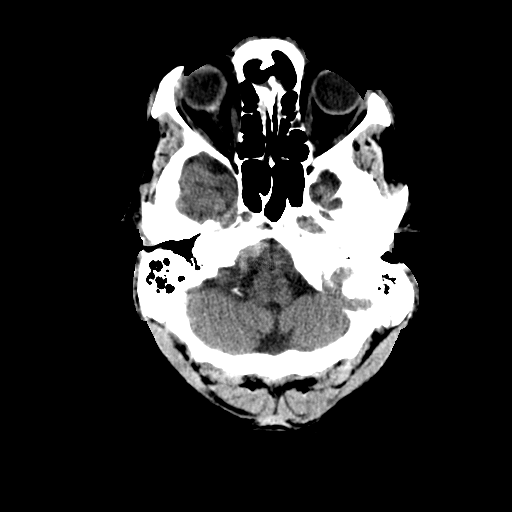
[im 8/32  brain]
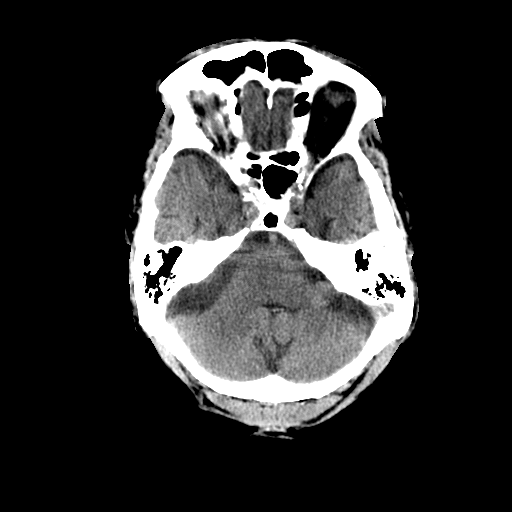
[im 9/32  brain]
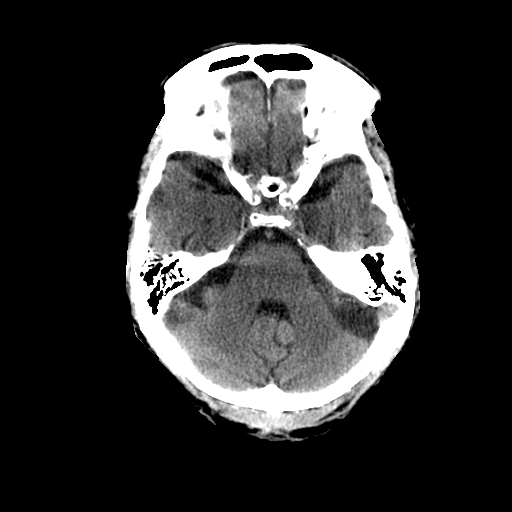
[im 9/32  bone]
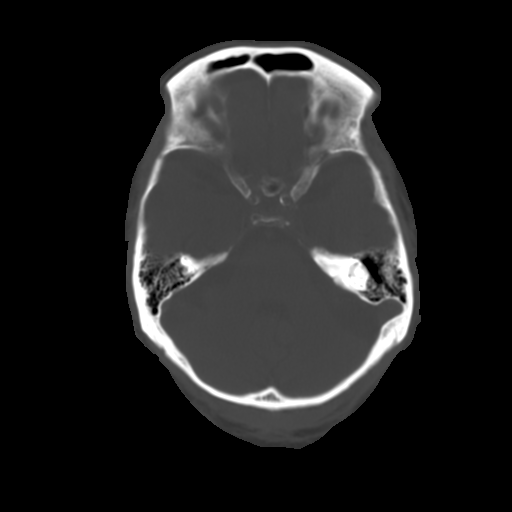
[im 11/32  brain]
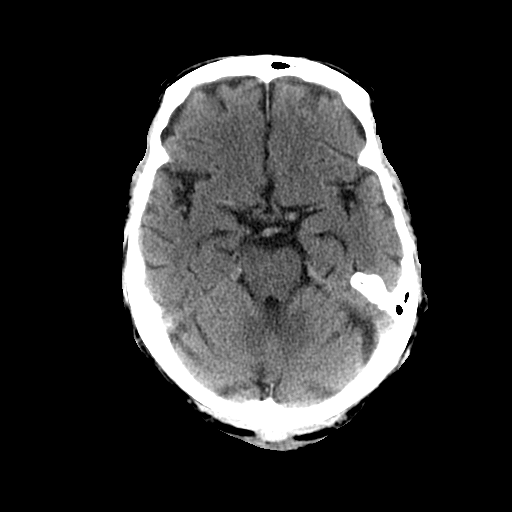
[im 13/32  brain]
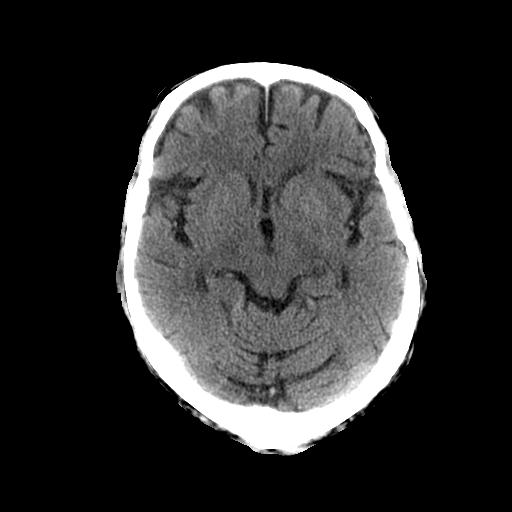
[im 15/32  brain]
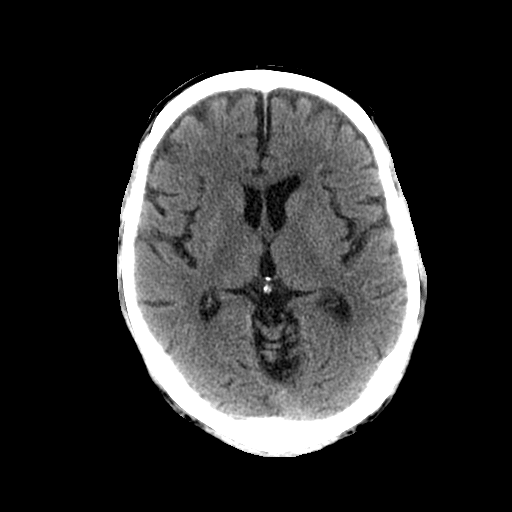
[im 17/32  brain]
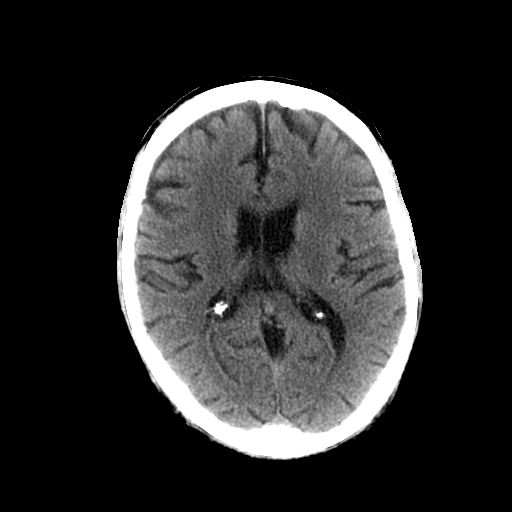
[im 17/32  bone]
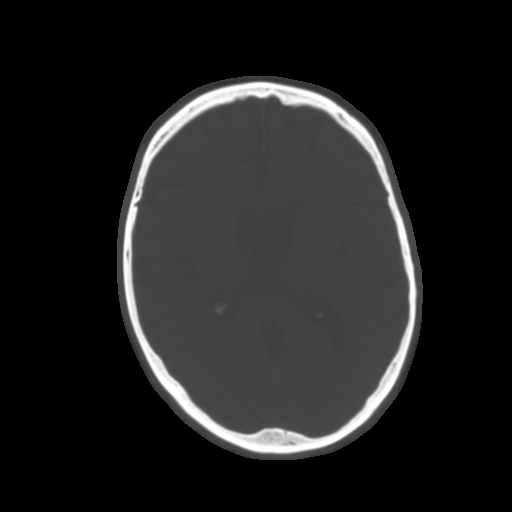
[im 19/32  brain]
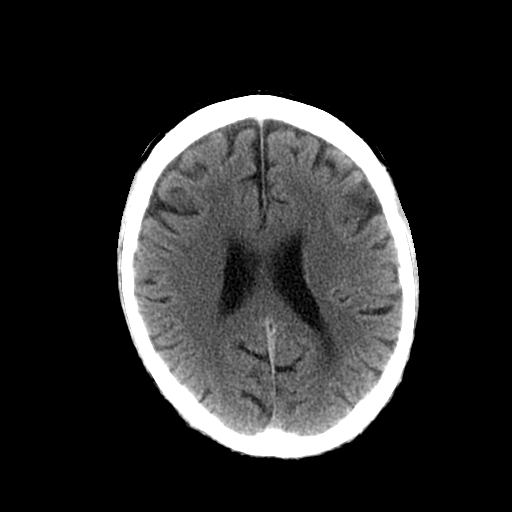
[im 21/32  brain]
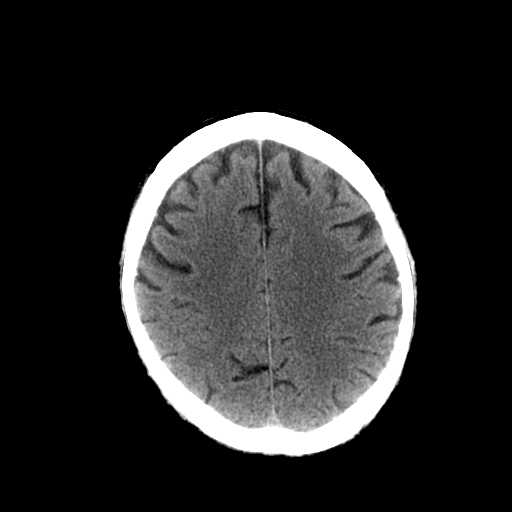
[im 23/32  brain]
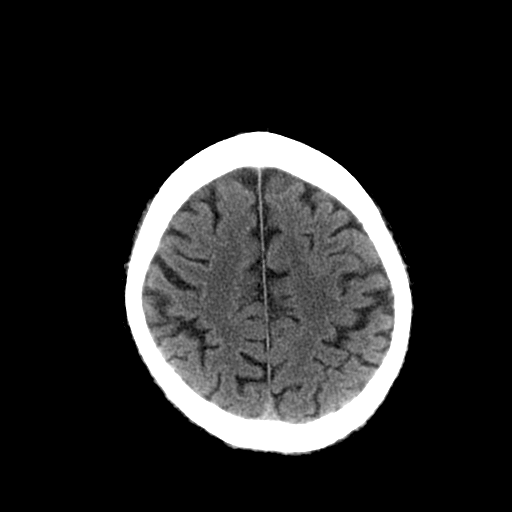
[im 24/32  brain]
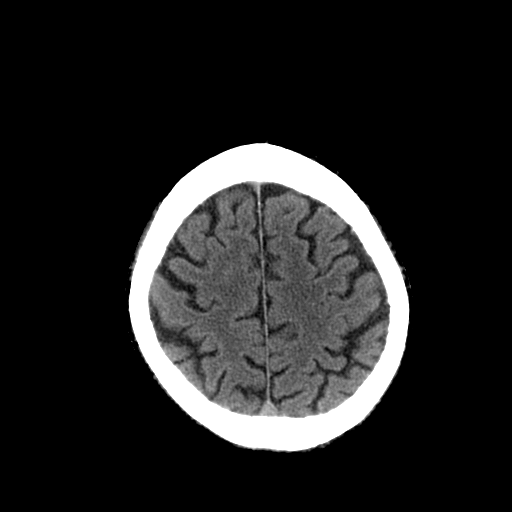
[im 24/32  bone]
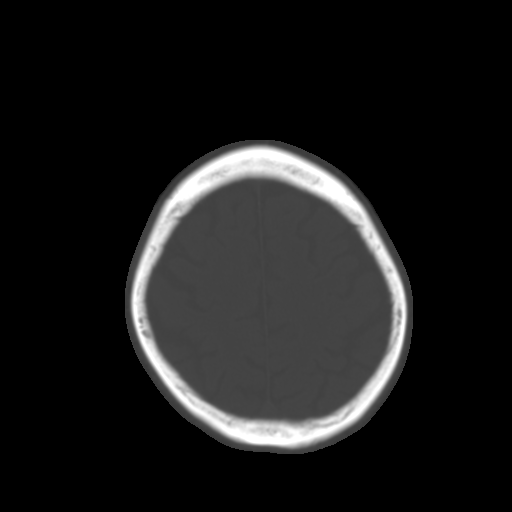
[im 26/32  brain]
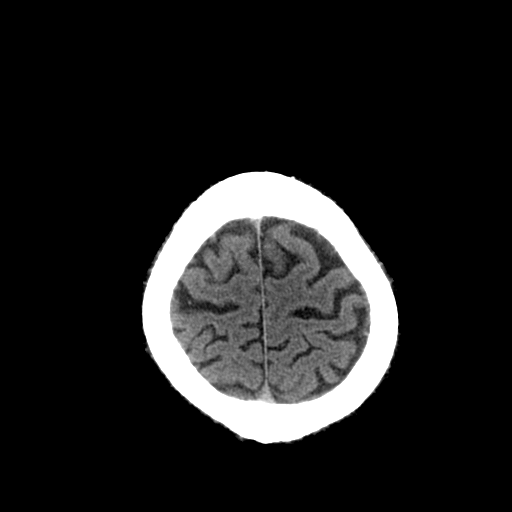
[im 28/32  brain]
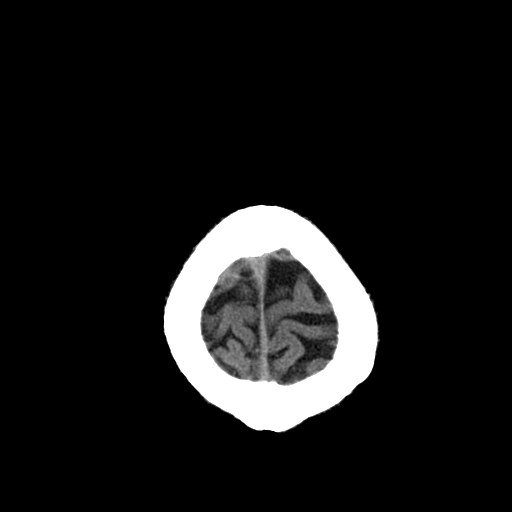
[im 30/32  brain]
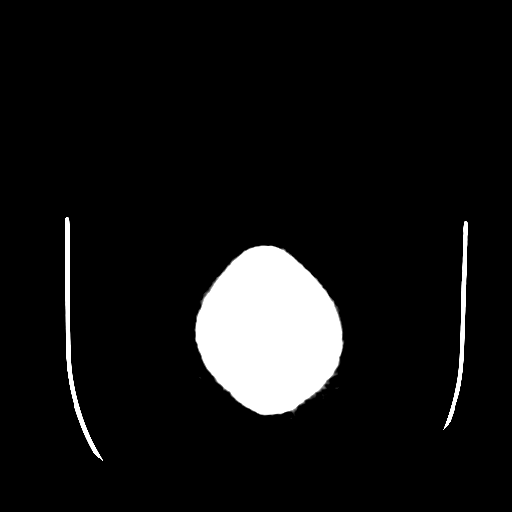

[16 of 30 positions shown; findings below may reference images not displayed]

FINDINGS: Calcified atherosclerosis at the skull base.  Visualized
orbits and scalp soft tissues are within normal limits.  Ethmoid
sinus mucosal thickening.  Otherwise, visualized paranasal sinuses
and mastoids are clear.  No acute osseous abnormality identified.

Cerebral volume is within normal limits for age.  Ventricular size
and configuration are within normal limits.  No midline shift, mass
effect, or evidence of mass lesion.  No acute intracranial
hemorrhage identified.  Gray-white matter differentiation is within
normal limits for age. No evidence of cortically based acute
infarction identified.  No suspicious intracranial vascular
hyperdensity.
IMPRESSION: No acute intracranial abnormality.  Negative noncontrast appearance
of the brain for age.

## 2011-06-27 ENCOUNTER — Ambulatory Visit: Payer: Medicare Other

## 2011-06-27 ENCOUNTER — Ambulatory Visit: Payer: Medicare Other | Attending: Internal Medicine | Admitting: Physical Therapy

## 2011-06-27 DIAGNOSIS — Z5189 Encounter for other specified aftercare: Secondary | ICD-10-CM | POA: Insufficient documentation

## 2011-06-27 DIAGNOSIS — I69998 Other sequelae following unspecified cerebrovascular disease: Secondary | ICD-10-CM | POA: Insufficient documentation

## 2011-06-27 DIAGNOSIS — I69919 Unspecified symptoms and signs involving cognitive functions following unspecified cerebrovascular disease: Secondary | ICD-10-CM | POA: Insufficient documentation

## 2011-06-27 DIAGNOSIS — R279 Unspecified lack of coordination: Secondary | ICD-10-CM | POA: Insufficient documentation

## 2011-06-27 DIAGNOSIS — R269 Unspecified abnormalities of gait and mobility: Secondary | ICD-10-CM | POA: Insufficient documentation

## 2011-06-27 DIAGNOSIS — M6281 Muscle weakness (generalized): Secondary | ICD-10-CM | POA: Insufficient documentation

## 2011-06-28 ENCOUNTER — Ambulatory Visit: Payer: Medicare Other | Admitting: Occupational Therapy

## 2011-06-30 ENCOUNTER — Ambulatory Visit: Payer: Medicare Other | Attending: Internal Medicine | Admitting: Physical Therapy

## 2011-06-30 DIAGNOSIS — R269 Unspecified abnormalities of gait and mobility: Secondary | ICD-10-CM | POA: Insufficient documentation

## 2011-06-30 DIAGNOSIS — Z5189 Encounter for other specified aftercare: Secondary | ICD-10-CM | POA: Insufficient documentation

## 2011-06-30 DIAGNOSIS — I69998 Other sequelae following unspecified cerebrovascular disease: Secondary | ICD-10-CM | POA: Insufficient documentation

## 2011-06-30 DIAGNOSIS — R279 Unspecified lack of coordination: Secondary | ICD-10-CM | POA: Insufficient documentation

## 2011-06-30 DIAGNOSIS — I69919 Unspecified symptoms and signs involving cognitive functions following unspecified cerebrovascular disease: Secondary | ICD-10-CM | POA: Insufficient documentation

## 2011-06-30 DIAGNOSIS — M6281 Muscle weakness (generalized): Secondary | ICD-10-CM | POA: Insufficient documentation

## 2011-07-04 ENCOUNTER — Ambulatory Visit: Payer: Medicare Other | Admitting: Physical Therapy

## 2011-07-06 ENCOUNTER — Ambulatory Visit: Payer: Medicare Other | Admitting: Occupational Therapy

## 2011-07-06 ENCOUNTER — Ambulatory Visit: Payer: Medicare Other | Admitting: Physical Therapy

## 2011-07-11 ENCOUNTER — Ambulatory Visit: Payer: Medicare Other | Admitting: Physical Therapy

## 2011-07-14 ENCOUNTER — Ambulatory Visit: Payer: Medicare Other | Admitting: Occupational Therapy

## 2011-07-14 ENCOUNTER — Ambulatory Visit: Payer: Medicare Other | Admitting: Physical Therapy

## 2011-07-18 ENCOUNTER — Ambulatory Visit: Payer: Medicare Other | Admitting: Physical Therapy

## 2011-07-20 ENCOUNTER — Ambulatory Visit: Payer: Medicare Other | Admitting: Physical Therapy

## 2011-07-20 ENCOUNTER — Ambulatory Visit: Payer: Medicare Other | Admitting: Occupational Therapy

## 2011-07-26 ENCOUNTER — Ambulatory Visit: Payer: Medicare Other | Admitting: Physical Therapy

## 2011-07-28 ENCOUNTER — Ambulatory Visit: Payer: Medicare Other | Admitting: Occupational Therapy

## 2011-07-28 ENCOUNTER — Ambulatory Visit: Payer: Medicare Other | Admitting: Physical Therapy

## 2011-08-08 ENCOUNTER — Ambulatory Visit (INDEPENDENT_AMBULATORY_CARE_PROVIDER_SITE_OTHER): Payer: Medicare Other | Admitting: Cardiology

## 2011-08-08 ENCOUNTER — Encounter: Payer: Self-pay | Admitting: Cardiology

## 2011-08-08 VITALS — BP 104/58 | HR 70 | Ht 72.0 in | Wt 195.8 lb

## 2011-08-08 DIAGNOSIS — I2510 Atherosclerotic heart disease of native coronary artery without angina pectoris: Secondary | ICD-10-CM

## 2011-08-08 DIAGNOSIS — G459 Transient cerebral ischemic attack, unspecified: Secondary | ICD-10-CM

## 2011-08-08 DIAGNOSIS — E7800 Pure hypercholesterolemia, unspecified: Secondary | ICD-10-CM

## 2011-08-08 NOTE — Assessment & Plan Note (Signed)
Stable at the present time.  No chest pain.  Now on plavix.

## 2011-08-08 NOTE — Assessment & Plan Note (Addendum)
Stable and near complete recovery.  Results of objective testing was apparently negative.  We will request records.  He was placed on plavix and will defer any decisions to them.

## 2011-08-08 NOTE — Progress Notes (Signed)
   HPI:  Mr. Philip Herrera is in for a followup visit he was recently at the beach with his children. As he was getting on the elevator the garbled speech, and he had weakness over the right face. They thought he was having a stroke and called 911. He went to [facility] in [place]. There he had a CT scan, MRI, and carotid Dopplers. He also had a 2-D echocardiogram. We do not have access to these data, but apparently the 2-D and carotid were okay. His daughter is not aware of any findings on the MRI. He was thought to have TIA, and his aspirin was changed to Plavix. He has had followup with Dr. Timothy Lasso times two.  No new symptoms.       Current Outpatient Prescriptions  Medication Sig Dispense Refill  . clopidogrel (PLAVIX) 75 MG tablet Take 75 mg by mouth daily.      Marland Kitchen levothyroxine (SYNTHROID, LEVOTHROID) 75 MCG tablet Take 75 mcg by mouth daily.        . memantine (NAMENDA) 10 MG tablet Take 10 mg by mouth 2 (two) times daily.       . Multiple Vitamin (MULTIVITAMIN) tablet Take 1 tablet by mouth daily.        . nitroGLYCERIN (NITROSTAT) 0.4 MG SL tablet Place 0.4 mg under the tongue every 5 (five) minutes as needed. Limit of 3 at a time.       . pravastatin (PRAVACHOL) 40 MG tablet take 2 tablets by mouth at bedtime  60 tablet  5    No Known Allergies  Past Medical History  Diagnosis Date  . Coronary atherosclerosis of artery bypass graft   . Coronary atherosclerosis of artery bypass graft   . Coronary atherosclerosis of artery bypass graft   . Coronary atherosclerosis of artery bypass graft   . Pure hypercholesterolemia   . Unspecified hypothyroidism     Past Surgical History  Procedure Date  . Coronary artery bypass graft   . Appendectomy   . Coronary angioplasty with stent placement     No family history on file.  History   Social History  . Marital Status: Widowed    Spouse Name: N/A    Number of Children: N/A  . Years of Education: N/A   Occupational History  . Not  on file.   Social History Main Topics  . Smoking status: Never Smoker   . Smokeless tobacco: Not on file  . Alcohol Use: No  . Drug Use: No  . Sexually Active: Not on file   Other Topics Concern  . Not on file   Social History Narrative  . No narrative on file    ROS: Please see the HPI.  All other systems reviewed and negative.  PHYSICAL EXAM:  BP 104/58  Pulse 70  Ht 6' (1.829 m)  Wt 195 lb 12.8 oz (88.814 kg)  BMI 26.56 kg/m2  General: Well developed, well nourished, in no acute distress. Head:  Normocephalic and atraumatic. Neck: no JVD.  No definite carotid bruits.   Lungs: Clear to auscultation and percussion. Heart: Normal S1 and S2.  No murmur, rubs or gallops.  Heart sounds distant.   Abdomen:  Normal bowel sounds; soft; non tender; no organomegaly Pulses: Pulses normal in all 4 extremities. Extremities: No clubbing or cyanosis. No edema. Neurologic: Alert and oriented x 3.  EKG:  NSR.  First degree av block.  Inferior MI, old.    ASSESSMENT AND PLAN:

## 2011-08-08 NOTE — Patient Instructions (Signed)
Your physician wants you to follow-up in: 6 MONTHS You will receive a reminder letter in the mail two months in advance. If you don't receive a letter, please call our office to schedule the follow-up appointment. 

## 2011-08-08 NOTE — Assessment & Plan Note (Signed)
Followed in primary care.  Remains on statin.

## 2011-08-09 ENCOUNTER — Other Ambulatory Visit: Payer: Self-pay | Admitting: Cardiology

## 2012-02-02 ENCOUNTER — Other Ambulatory Visit: Payer: Self-pay | Admitting: Cardiology

## 2012-02-02 MED ORDER — PRAVASTATIN SODIUM 40 MG PO TABS: 40.0000 mg | ORAL_TABLET | Freq: Every day | ORAL | Status: DC

## 2012-02-06 ENCOUNTER — Encounter: Payer: Self-pay | Admitting: Cardiology

## 2012-02-06 ENCOUNTER — Ambulatory Visit (INDEPENDENT_AMBULATORY_CARE_PROVIDER_SITE_OTHER): Payer: Medicare Other | Admitting: Cardiology

## 2012-02-06 VITALS — BP 112/66 | HR 64 | Ht 72.0 in | Wt 192.0 lb

## 2012-02-06 DIAGNOSIS — E7800 Pure hypercholesterolemia, unspecified: Secondary | ICD-10-CM

## 2012-02-06 DIAGNOSIS — I440 Atrioventricular block, first degree: Secondary | ICD-10-CM

## 2012-02-06 DIAGNOSIS — I2510 Atherosclerotic heart disease of native coronary artery without angina pectoris: Secondary | ICD-10-CM

## 2012-02-06 NOTE — Progress Notes (Signed)
   HPI:  Philip Herrera is back in for followup. He really does pretty well. He continues to get together in the morning with his buddies, and ago breakfast together. He mostly sleeps most only during the day and night, and is been increasingly inactive over time. He denies any chest pain or other major cardiac symptoms. We discussed in some detail his need for followup. He has no new symptoms.  Current Outpatient Prescriptions  Medication Sig Dispense Refill  . cefdinir (OMNICEF) 300 MG capsule       . clopidogrel (PLAVIX) 75 MG tablet Take 75 mg by mouth daily.      Marland Kitchen levothyroxine (SYNTHROID, LEVOTHROID) 75 MCG tablet Take 75 mcg by mouth daily.        . memantine (NAMENDA) 10 MG tablet Take 10 mg by mouth 2 (two) times daily.       . Multiple Vitamin (MULTIVITAMIN) tablet Take 1 tablet by mouth daily.        . nitroGLYCERIN (NITROSTAT) 0.4 MG SL tablet Place 0.4 mg under the tongue every 5 (five) minutes as needed. Limit of 3 at a time.       . pravastatin (PRAVACHOL) 40 MG tablet Take 2 tablets once daily at bedtime      . [DISCONTINUED] pravastatin (PRAVACHOL) 40 MG tablet Take 1 tablet (40 mg total) by mouth daily.  60 tablet  5    No Known Allergies  Past Medical History  Diagnosis Date  . Coronary atherosclerosis of artery bypass graft   . Coronary atherosclerosis of artery bypass graft   . Coronary atherosclerosis of artery bypass graft   . Coronary atherosclerosis of artery bypass graft   . Pure hypercholesterolemia   . Unspecified hypothyroidism     Past Surgical History  Procedure Date  . Coronary artery bypass graft   . Appendectomy   . Coronary angioplasty with stent placement     No family history on file.  History   Social History  . Marital Status: Widowed    Spouse Name: N/A    Number of Children: N/A  . Years of Education: N/A   Occupational History  . Not on file.   Social History Main Topics  . Smoking status: Never Smoker   . Smokeless tobacco: Not on  file  . Alcohol Use: No  . Drug Use: No  . Sexually Active: Not on file   Other Topics Concern  . Not on file   Social History Narrative  . No narrative on file    ROS: Please see the HPI.  All other systems reviewed and negative.  PHYSICAL EXAM:  BP 112/66  Pulse 64  Ht 6' (1.829 m)  Wt 192 lb (87.091 kg)  BMI 26.04 kg/m2  SpO2 98%  General: Well developed, well nourished, in no acute distress. Head:  Normocephalic and atraumatic. Neck: no JVD Lungs: Clear to auscultation and percussion. Heart: Normal S1 and S2.  No murmur, rubs or gallops.  Pulses: Pulses normal in all 4 extremities. Extremities: No clubbing or cyanosis. No edema. Neurologic: Alert and oriented x 3.  EKG:  NSR.  First degree av block.   Inferior MI, old.   Nonspecific T wave flattening.    ASSESSMENT AND PLAN:

## 2012-02-06 NOTE — Assessment & Plan Note (Signed)
This is managed in primary care.

## 2012-02-06 NOTE — Assessment & Plan Note (Signed)
His AV block is mild. The PR interval is 270 ms. I recommended he have an annual EKG with Dr. Timothy Lasso. Unless you have further problems he would not likely need further followup in the cardiology clinic at this point in time

## 2012-02-06 NOTE — Assessment & Plan Note (Signed)
Patient is stable at the present time. He's had no recurrent anginal type symptoms.

## 2012-02-06 NOTE — Patient Instructions (Signed)
Your physician recommends that you schedule a follow-up appointment as needed.   Your physician recommends that you continue on your current medications as directed. Please refer to the Current Medication list given to you today.  

## 2012-08-06 ENCOUNTER — Other Ambulatory Visit: Payer: Self-pay | Admitting: Internal Medicine

## 2012-08-06 DIAGNOSIS — R945 Abnormal results of liver function studies: Secondary | ICD-10-CM

## 2012-08-07 ENCOUNTER — Ambulatory Visit
Admission: RE | Admit: 2012-08-07 | Discharge: 2012-08-07 | Disposition: A | Discharge status: Home / Self Care | Payer: Medicare Other | Source: Ambulatory Visit | Attending: Internal Medicine | Admitting: Internal Medicine

## 2012-08-07 DIAGNOSIS — R945 Abnormal results of liver function studies: Secondary | ICD-10-CM

## 2015-02-25 ENCOUNTER — Ambulatory Visit (INDEPENDENT_AMBULATORY_CARE_PROVIDER_SITE_OTHER): Payer: Medicare Other | Admitting: Podiatry

## 2015-02-25 ENCOUNTER — Encounter: Payer: Self-pay | Admitting: Podiatry

## 2015-02-25 VITALS — BP 149/69 | HR 86 | Resp 12

## 2015-02-25 DIAGNOSIS — L609 Nail disorder, unspecified: Secondary | ICD-10-CM | POA: Diagnosis not present

## 2015-02-25 DIAGNOSIS — L608 Other nail disorders: Secondary | ICD-10-CM

## 2015-02-25 NOTE — Patient Instructions (Signed)
Today your foot examination demonstrated good pulsation and good reflexes The toenails are mildly curl otherwise normal Okay to trim the toenails and gently smooth the ends If you decide to go to pedicurist do not place your feet and the whirlpool or do not manipulate cuticles

## 2015-02-25 NOTE — Progress Notes (Signed)
   Subjective:    Patient ID: Philip GlassmanJack H Banos, male    DOB: [birth year], 81 y.o.   MRN: [id number]  HPI    This patient presents today with his daughter in the treatment room. The daughter is requesting that her father's toenails trimmed because he is on Plavix and she's uncomfortable trimming the nails if she cause any bleeding. There is no history of nail infection. Patient's daughter denies any previous podiatric care. The patient is not able to respond and his daughter is responding. Patient lives with relatives and returns home for short periods of time under the supervision of relatives  Review of Systems  HENT: Positive for hearing loss and sinus pressure.   Hematological: Bruises/bleeds easily.  Psychiatric/Behavioral: Positive for confusion.       Objective:   Physical Exam  Patient is non-responsive to questioning Vascular: Out pitting edema bilaterally DP and PT pulses 2/4 bilaterally  Capillary reflex immediate bilaterally  Neurological: Ankle reflex equal and reactive bilaterally Patient not able to respond to 10 g monofilament wire Patient not able to respond to tuning fork  Dermatological: No open skin lesions bilaterally The toenails elongated and incurvated with normal texture  Musculoskeletal: No deformities noted bilaterally There is no restriction in ankle, subtalar, midtarsal joints bilaterally Patient is ambulatory      Assessment & Plan:   Assessment: Non-orientated 3 patient presents with daughter Incurvated toenails 6-10  Plan: Today I told her daughter and I would trim the toenails if she requested, however, Medicare would consider this a noncovered service. Patient said she would like me to trim the toenails. Also, discussed the option of having the nails trimmed data pedicurist with instructions to not place feet and whirlpool and do not manipulate the cuticle  Toenails 6-10 were debrided mechanically electrically as  non-dystrophic  Reappoint when necessary at patient's daughter's request

## 2015-02-26 ENCOUNTER — Encounter: Payer: Self-pay | Admitting: Podiatry

## 2015-08-21 ENCOUNTER — Other Ambulatory Visit: Payer: Self-pay

## 2015-08-21 ENCOUNTER — Encounter (HOSPITAL_COMMUNITY): Payer: Self-pay | Admitting: *Deleted

## 2015-08-21 ENCOUNTER — Emergency Department (HOSPITAL_COMMUNITY): Payer: Medicare Other

## 2015-08-21 ENCOUNTER — Emergency Department (HOSPITAL_COMMUNITY)
Admission: EM | Admit: 2015-08-21 | Discharge: 2015-08-21 | Disposition: A | Discharge status: Home / Self Care | Payer: Medicare Other | Attending: Emergency Medicine | Admitting: Emergency Medicine

## 2015-08-21 DIAGNOSIS — R0602 Shortness of breath: Secondary | ICD-10-CM | POA: Insufficient documentation

## 2015-08-21 DIAGNOSIS — F0390 Unspecified dementia without behavioral disturbance: Secondary | ICD-10-CM | POA: Diagnosis not present

## 2015-08-21 DIAGNOSIS — Z8673 Personal history of transient ischemic attack (TIA), and cerebral infarction without residual deficits: Secondary | ICD-10-CM | POA: Diagnosis not present

## 2015-08-21 DIAGNOSIS — Z79899 Other long term (current) drug therapy: Secondary | ICD-10-CM | POA: Insufficient documentation

## 2015-08-21 DIAGNOSIS — Z951 Presence of aortocoronary bypass graft: Secondary | ICD-10-CM | POA: Insufficient documentation

## 2015-08-21 DIAGNOSIS — R531 Weakness: Secondary | ICD-10-CM | POA: Insufficient documentation

## 2015-08-21 DIAGNOSIS — I10 Essential (primary) hypertension: Secondary | ICD-10-CM | POA: Diagnosis not present

## 2015-08-21 DIAGNOSIS — I2510 Atherosclerotic heart disease of native coronary artery without angina pectoris: Secondary | ICD-10-CM | POA: Diagnosis not present

## 2015-08-21 DIAGNOSIS — E039 Hypothyroidism, unspecified: Secondary | ICD-10-CM | POA: Insufficient documentation

## 2015-08-21 HISTORY — DX: Cerebral infarction, unspecified: I63.9

## 2015-08-21 LAB — BASIC METABOLIC PANEL
Anion gap: 7 (ref 5–15)
BUN: 34 mg/dL — AB (ref 6–20)
CO2: 23 mmol/L (ref 22–32)
CREATININE: 1.61 mg/dL — AB (ref 0.61–1.24)
Calcium: 8.4 mg/dL — ABNORMAL LOW (ref 8.9–10.3)
Chloride: 108 mmol/L (ref 101–111)
GFR, EST AFRICAN AMERICAN: 41 mL/min — AB (ref >60)
GFR, EST NON AFRICAN AMERICAN: 36 mL/min — AB (ref >60)
Glucose, Bld: 176 mg/dL — ABNORMAL HIGH (ref 65–99)
Potassium: 4.4 mmol/L (ref 3.5–5.1)
SODIUM: 138 mmol/L (ref 135–145)

## 2015-08-21 LAB — URINALYSIS, ROUTINE W REFLEX MICROSCOPIC
BILIRUBIN URINE: NEGATIVE
Glucose, UA: NEGATIVE mg/dL
KETONES UR: NEGATIVE mg/dL
Nitrite: NEGATIVE
PROTEIN: 30 mg/dL — AB
Specific Gravity, Urine: 1.021 (ref 1.005–1.030)
pH: 6 (ref 5.0–8.0)

## 2015-08-21 LAB — I-STAT TROPONIN, ED: TROPONIN I, POC: 0 ng/mL (ref 0.00–0.08)

## 2015-08-21 LAB — URINE MICROSCOPIC-ADD ON

## 2015-08-21 LAB — CBC
HCT: 36.9 % — ABNORMAL LOW (ref 39.0–52.0)
Hemoglobin: 11.4 g/dL — ABNORMAL LOW (ref 13.0–17.0)
MCH: 30.2 pg (ref 26.0–34.0)
MCHC: 30.9 g/dL (ref 30.0–36.0)
MCV: 97.9 fL (ref 78.0–100.0)
PLATELETS: 70 10*3/uL — AB (ref 150–400)
RBC: 3.77 MIL/uL — ABNORMAL LOW (ref 4.22–5.81)
RDW: 16.7 % — AB (ref 11.5–15.5)
WBC: 12 10*3/uL — ABNORMAL HIGH (ref 4.0–10.5)

## 2015-08-21 LAB — BRAIN NATRIURETIC PEPTIDE: B NATRIURETIC PEPTIDE 5: 132.5 pg/mL — AB (ref 0.0–100.0)

## 2015-08-21 MED ORDER — AZITHROMYCIN 250 MG PO TABS: ORAL_TABLET | ORAL | Status: AC

## 2015-08-21 MED ORDER — SODIUM CHLORIDE 0.9 % IV BOLUS (SEPSIS)
500.0000 mL | Freq: Once | INTRAVENOUS | Status: AC
  Administered 2015-08-21: 500 mL via INTRAVENOUS

## 2015-08-21 NOTE — Discharge Instructions (Signed)
Zithromax as prescribed.  Return to the emergency department if you develop worsening breathing, chest pains, high fever, or other new and concerning symptoms.   Weakness Weakness is a lack of strength. It may be felt all over the body (generalized) or in one specific part of the body (focal). Some causes of weakness can be serious. You may need further medical evaluation, especially if you are elderly or you have a history of immunosuppression (such as chemotherapy or HIV), kidney disease, heart disease, or diabetes. CAUSES  Weakness can be caused by many different things, including:  Infection.  Physical exhaustion.  Internal bleeding or other blood loss that results in a lack of red blood cells (anemia).  Dehydration. This cause is more common in elderly people.  Side effects or electrolyte abnormalities from medicines, such as pain medicines or sedatives.  Emotional distress, anxiety, or depression.  Circulation problems, especially severe peripheral arterial disease.  Heart disease, such as rapid atrial fibrillation, bradycardia, or heart failure.  Nervous system disorders, such as Guillain-Barr syndrome, multiple sclerosis, or stroke. DIAGNOSIS  To find the cause of your weakness, your caregiver will take your history and perform a physical exam. Lab tests or X-rays may also be ordered, if needed. TREATMENT  Treatment of weakness depends on the cause of your symptoms and can vary greatly. HOME CARE INSTRUCTIONS   Rest as needed.  Eat a well-balanced diet.  Try to get some exercise every day.  Only take over-the-counter or prescription medicines as directed by your caregiver. SEEK MEDICAL CARE IF:   Your weakness seems to be getting worse or spreads to other parts of your body.  You develop new aches or pains. SEEK IMMEDIATE MEDICAL CARE IF:   You cannot perform your normal daily activities, such as getting dressed and feeding yourself.  You cannot walk up and  down stairs, or you feel exhausted when you do so.  You have shortness of breath or chest pain.  You have difficulty moving parts of your body.  You have weakness in only one area of the body or on only one side of the body.  You have a fever.  You have trouble speaking or swallowing.  You cannot control your bladder or bowel movements.  You have black or bloody vomit or stools. MAKE SURE YOU:  Understand these instructions.  Will watch your condition.  Will get help right away if you are not doing well or get worse.   This information is not intended to replace advice given to you by your health care provider. Make sure you discuss any questions you have with your health care provider.   Document Released: 02/14/2005 Document Revised: 08/16/2011 Document Reviewed: 04/15/2011 Elsevier Interactive Patient Education [employer]2016 [employer] Inc.

## 2015-08-21 NOTE — ED Notes (Signed)
Per pts daughter pt is more fatigued today, symtom onset x 1 mth, pts daughter reports the pt has not had an appetite & had SOB with exertion today, pt hard of hearing with hx of CVA & CABG & x 1 stent, pt takes Plavix, pt denies pain, hx of dementia

## 2015-08-21 NOTE — ED Provider Notes (Signed)
CSN: [id number]     Arrival date & time 08/21/15  1215 History   First MD Initiated Contact with Patient 08/21/15 1407     Chief Complaint  Patient presents with  . Shortness of Breath  . Fatigue     (Consider location/radiation/quality/duration/timing/severity/associated sxs/prior Treatment) HPI Comments: Patient is a 82 year old male with extensive past medical history including coronary artery disease with CABG and stents,, hypertension, and mild dementia. He is brought by his daughter for evaluation of fatigue and weakness that has worsened over the past month. The daughter reports he has not had much of an appetite and seems to get tired with minimal exertion. The patient is somewhat hard of hearing which makes taking a history from him difficult, however he denies to me he is experiencing any pain, shortness of breath, or other complaints. There have been no fevers. There is been no cough.  Patient is a 82 y.o. male presenting with shortness of breath. The history is provided by the patient.  Shortness of Breath Severity:  Moderate Onset quality:  Gradual Duration:  1 month Timing:  Constant Progression:  Worsening Chronicity:  New Context: activity   Relieved by:  Nothing Worsened by:  Nothing tried Ineffective treatments:  None tried   Past Medical History  Diagnosis Date  . Coronary atherosclerosis of artery bypass graft   . Coronary atherosclerosis of artery bypass graft   . Coronary atherosclerosis of artery bypass graft   . Coronary atherosclerosis of artery bypass graft   . Pure hypercholesterolemia   . Unspecified hypothyroidism   . Stroke [facility])    Past Surgical History  Procedure Laterality Date  . Coronary artery bypass graft    . Appendectomy    . Coronary angioplasty with stent placement     No family history on file. Social History  Substance Use Topics  . Smoking status: Never Smoker   . Smokeless tobacco: None  . Alcohol Use: No    Review of  Systems  Respiratory: Positive for shortness of breath.   All other systems reviewed and are negative.     Allergies  Review of patient's allergies indicates no known allergies.  Home Medications   Prior to Admission medications   Medication Sig Start Date End Date Taking? Authorizing Provider  clopidogrel (PLAVIX) 75 MG tablet Take 75 mg by mouth daily.   Yes Historical Provider, MD  finasteride (PROSCAR) 5 MG tablet Take 5 mg by mouth daily.   Yes Historical Provider, MD  levothyroxine (SYNTHROID, LEVOTHROID) 75 MCG tablet Take 75 mcg by mouth daily.     Yes Historical Provider, MD  Memantine HCl ER (NAMENDA XR) 21 MG CP24 Take 21 mg by mouth daily.   Yes Historical Provider, MD  Multiple Vitamin (MULTIVITAMIN) tablet Take 1 tablet by mouth daily.     Yes Historical Provider, MD  pravastatin (PRAVACHOL) 40 MG tablet Take 2 tablets once daily at bedtime   Yes Herby Abrahamhomas D Stuckey, MD  sertraline (ZOLOFT) 25 MG tablet Take 25 mg by mouth daily.   Yes Historical Provider, MD  cefdinir (OMNICEF) 300 MG capsule  01/31/12   Historical Provider, MD  memantine (NAMENDA) 10 MG tablet Take 10 mg by mouth 2 (two) times daily.     Historical Provider, MD  nitroGLYCERIN (NITROSTAT) 0.4 MG SL tablet Place 0.4 mg under the tongue every 5 (five) minutes as needed. Limit of 3 at a time.     Historical Provider, MD   BP 108/85 mmHg  Pulse  105  Temp(Src) 97.8 F (36.6 C) (Oral)  Resp 18  Ht 6' (1.829 m)  Wt 186 lb 8 oz (84.596 kg)  BMI 25.29 kg/m2  SpO2 95% Physical Exam  Constitutional: He is oriented to person, place, and time. He appears well-developed and well-nourished. No distress.  HENT:  Head: Normocephalic and atraumatic.  Mouth/Throat: Oropharynx is clear and moist.  Eyes: EOM are normal. Pupils are equal, round, and reactive to light.  Neck: Normal range of motion. Neck supple.  Cardiovascular: Normal rate and regular rhythm.  Exam reveals no friction rub.   No murmur  heard. Pulmonary/Chest: Effort normal and breath sounds normal. No respiratory distress. He has no wheezes. He has no rales.  Abdominal: Soft. Bowel sounds are normal. He exhibits no distension. There is no tenderness.  Musculoskeletal: Normal range of motion. He exhibits no edema.  Neurological: He is alert and oriented to person, place, and time. No cranial nerve deficit. He exhibits normal muscle tone. Coordination normal.  Skin: Skin is warm and dry. He is not diaphoretic.  Nursing note and vitals reviewed.   ED Course  Procedures (including critical care time) Labs Review Labs Reviewed  BRAIN NATRIURETIC PEPTIDE - Abnormal; Notable for the following:    B Natriuretic Peptide 132.5 (*)    All other components within normal limits  BASIC METABOLIC PANEL - Abnormal; Notable for the following:    Glucose, Bld 176 (*)    BUN 34 (*)    Creatinine, Ser 1.61 (*)    Calcium 8.4 (*)    GFR calc non Af Amer 36 (*)    GFR calc Af Amer 41 (*)    All other components within normal limits  CBC - Abnormal; Notable for the following:    WBC 12.0 (*)    RBC 3.77 (*)    Hemoglobin 11.4 (*)    HCT 36.9 (*)    RDW 16.7 (*)    Platelets 70 (*)    All other components within normal limits  URINALYSIS, ROUTINE W REFLEX MICROSCOPIC (NOT AT [facility])  Rosezena Sensor, ED    Imaging Review Dg Chest 2 View  08/21/2015  CLINICAL DATA:  Shortness of breath. EXAM: CHEST  2 VIEW COMPARISON:  05/25/2003 . FINDINGS: Mediastinum hilar structures normal. Prior CABG. Cardiomegaly with normal pulmonary vascularity. Low lung volumes with mild bibasilar atelectasis and/or infiltrates. Small left pleural effusion cannot be excluded. No pneumothorax . IMPRESSION: 1. Prior CABG.  Cardiomegaly. 2. Low lung volumes with mild bibasilar subsegmental atelectasis and/or infiltrates. Small left pleural effusion cannot be excluded. Electronically Signed   By: Maisie Fus  Register   On: 08/21/2015 13:27   I have personally  reviewed and evaluated these images and lab results as part of my medical decision-making.   EKG Interpretation   Date/Time:  Friday August 21 2015 12:30:02 EDT Ventricular Rate:  107 PR Interval:    QRS Duration: 82 QT Interval:  450 QTC Calculation: 600 R Axis:   67 Text Interpretation:  Sinus rhythm First degree A-V block Premature atrial  complexes Confirmed by Georges Victorio  MD, Riley Lam ([id number]) on 08/21/2015 4:14:40 PM      MDM   Final diagnoses:  None    Patient presents with complaints of fatigue that has been worsening over the past month. The daughter is concerned that he has not been eating or drinking. She also reports he has had some shortness of breath. He has a cardiac history with bypass and one stent. His workup today reveals  no obvious abnormalities. His EKG is unchanged and reveals no ischemic changes. Laboratory studies reveal a normal troponin and are otherwise unremarkable. He has slight renal insufficiency, however no prior studies for comparison. He was given normal saline and appears well. I see no reason for hospitalization. As the chest x-ray suggests possibly bibasilar infiltrates, he will be treated with antibiotics.    Geoffery Lyonsouglas Lisbet Busker, MD 08/21/15 [phone]

## 2015-10-30 DEATH — deceased

## 2015-11-03 ENCOUNTER — Encounter: Payer: Self-pay | Admitting: Cardiology

## 2015-11-18 ENCOUNTER — Ambulatory Visit: Payer: Medicare Other | Admitting: Cardiology

## 2017-12-02 IMAGING — DX DG CHEST 2V
2 series · 2 of 2 positions shown · non-contrast
Comparison: 05/25/2003 .

CLINICAL DATA: Shortness of breath.

EXAM:
CHEST  2 VIEW

[w chest lat]
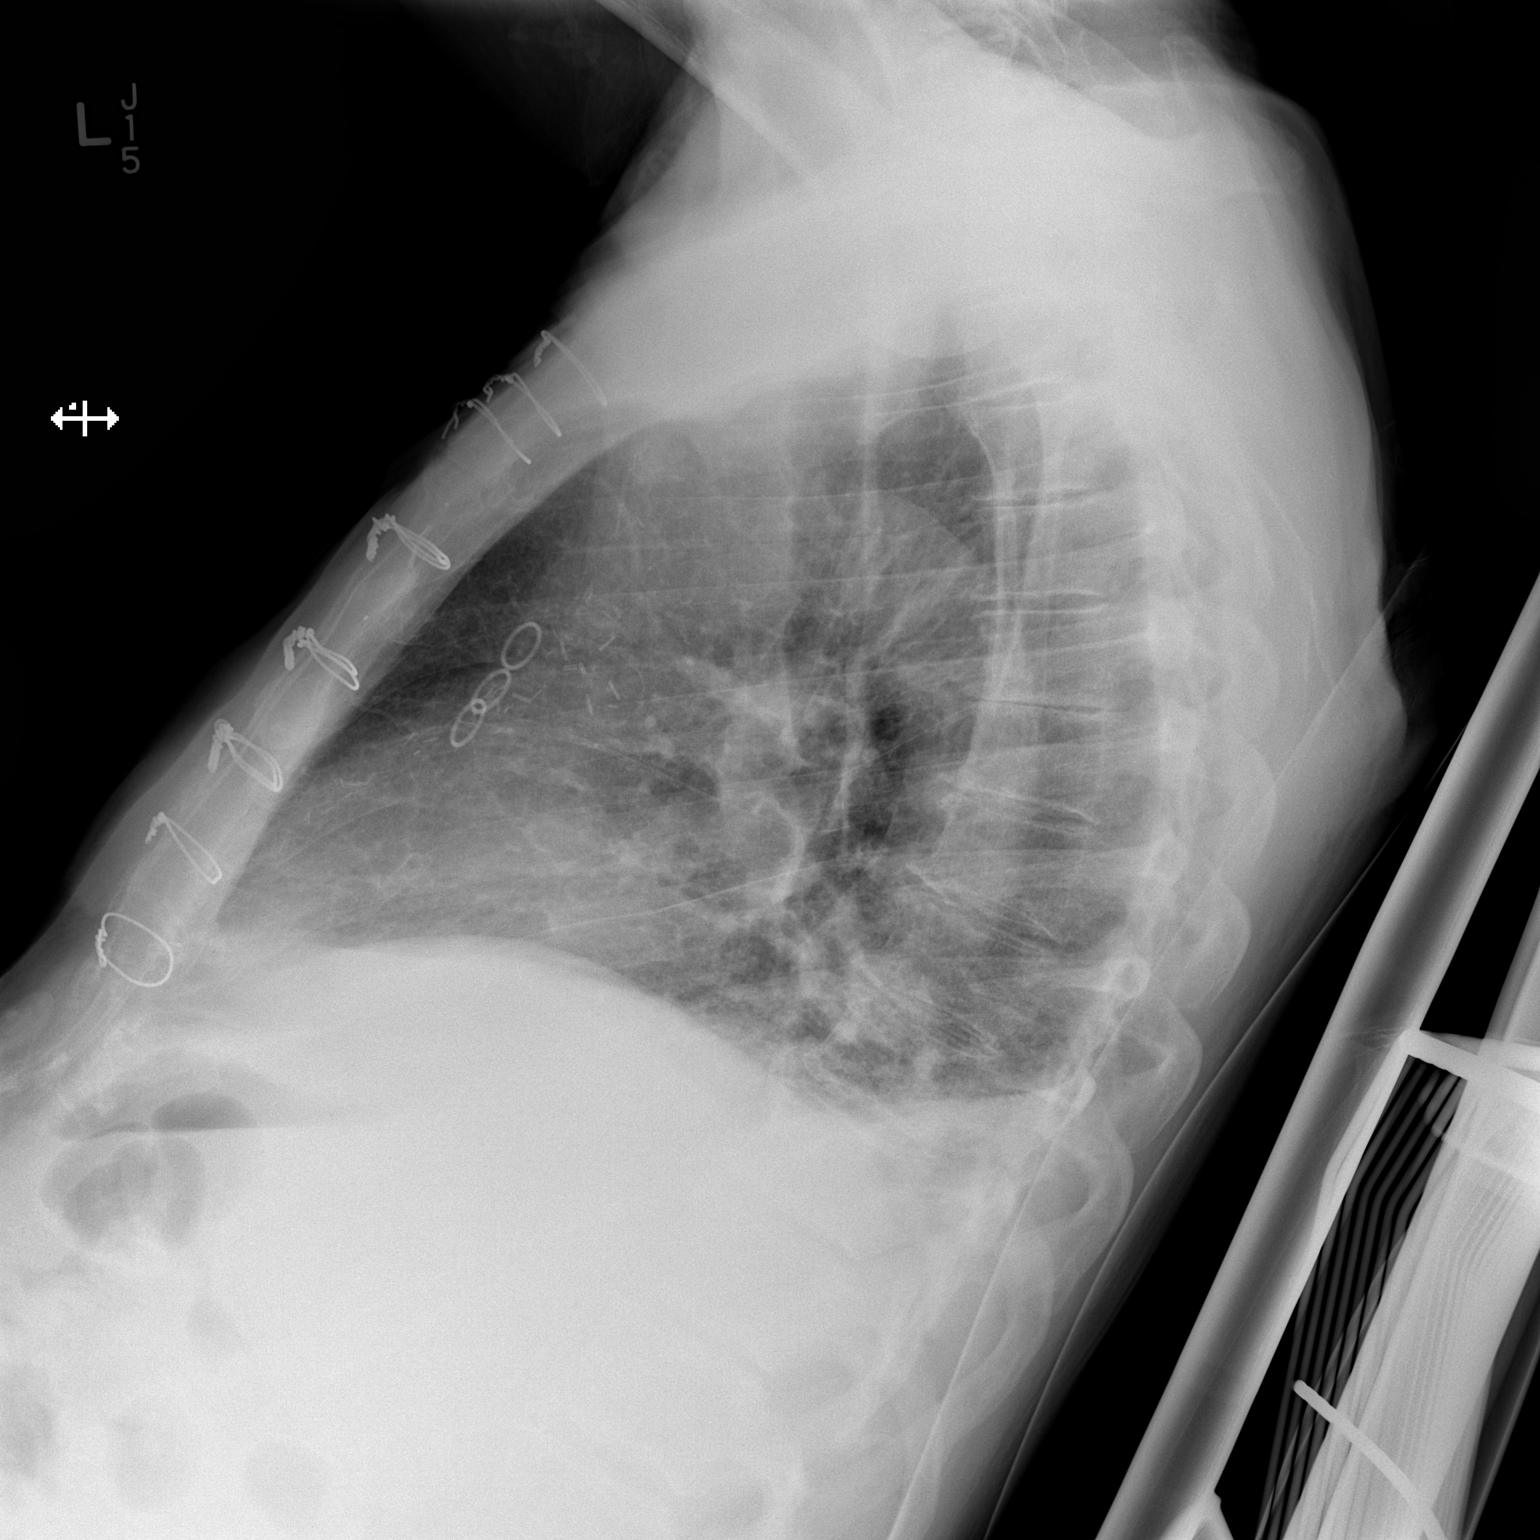

[x chest ap]
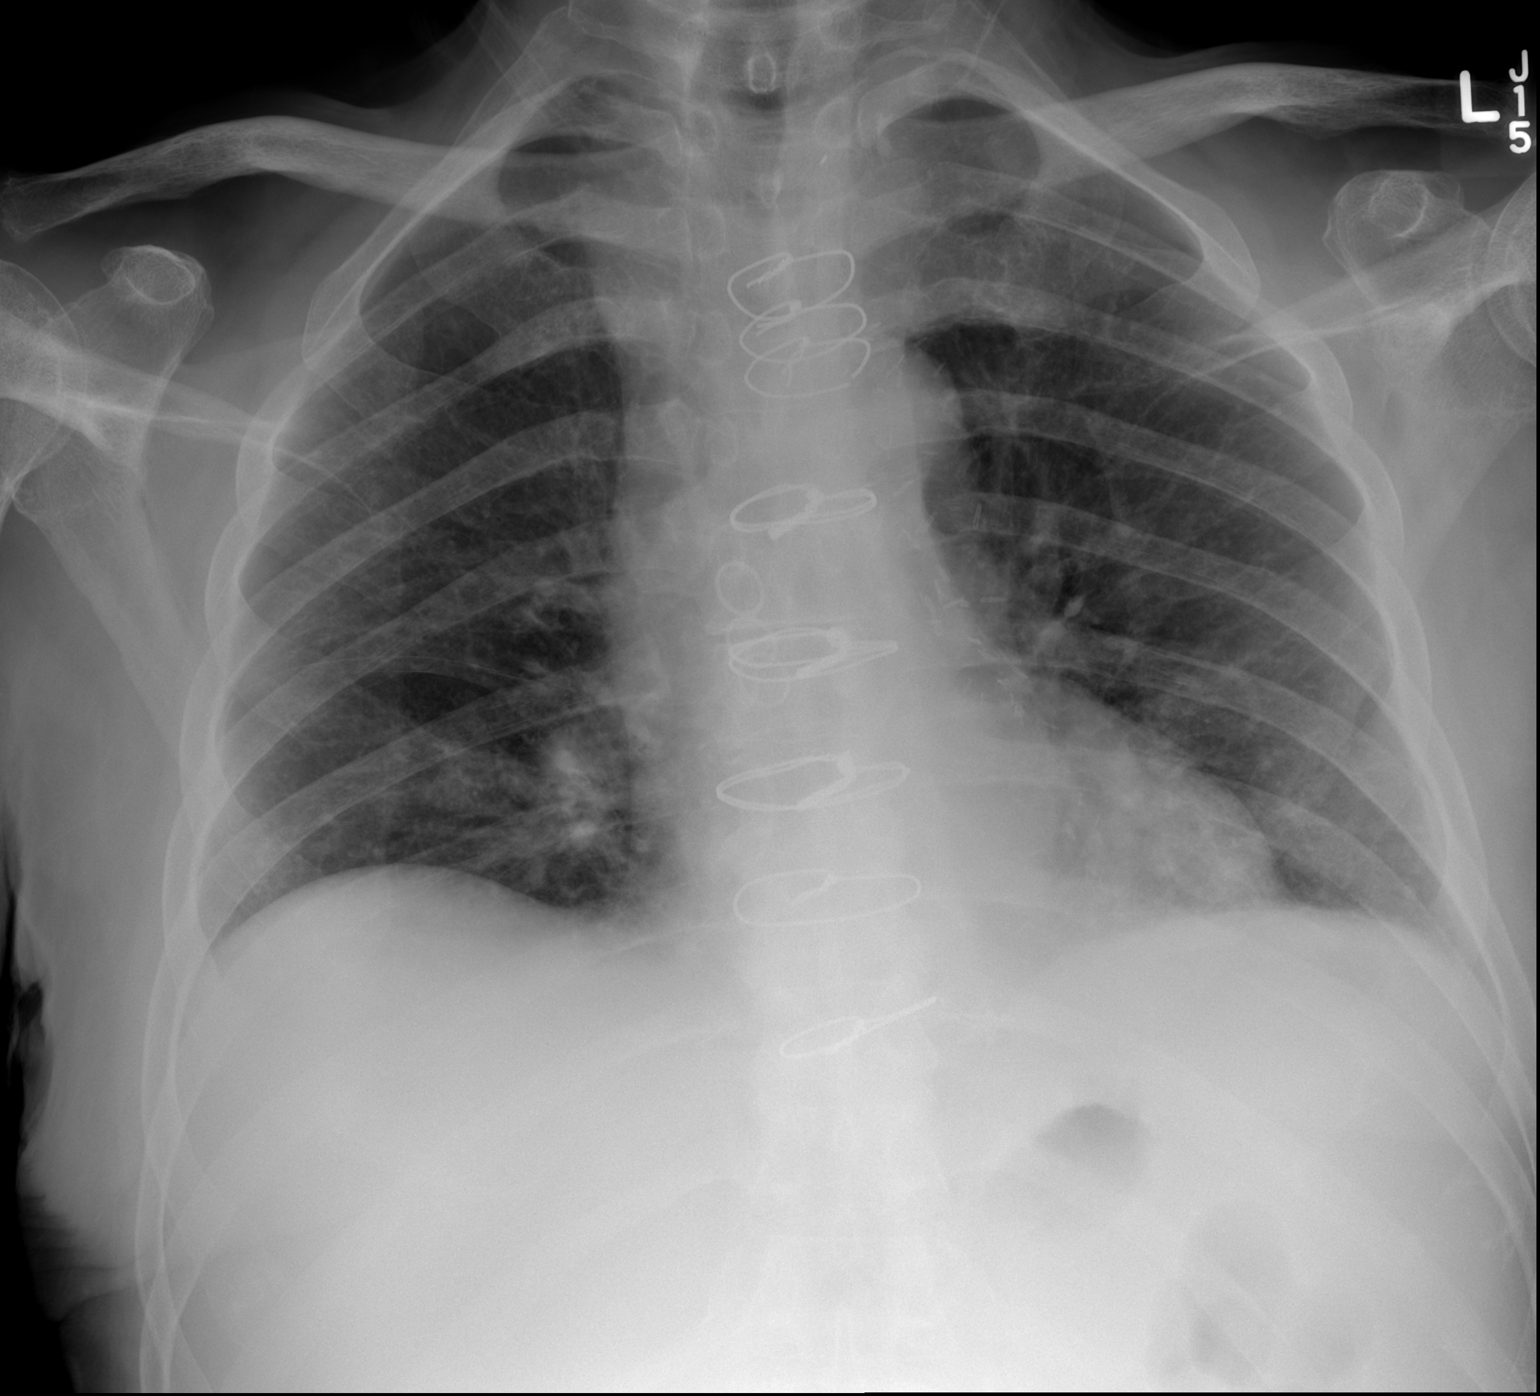

[2 of 2 positions shown; findings below may reference images not displayed]

FINDINGS: Mediastinum hilar structures normal. Prior CABG. Cardiomegaly with
normal pulmonary vascularity. Low lung volumes with mild bibasilar
atelectasis and/or infiltrates. Small left pleural effusion cannot
be excluded. No pneumothorax .
IMPRESSION: 1. Prior CABG.  Cardiomegaly.

2. Low lung volumes with mild bibasilar subsegmental atelectasis
and/or infiltrates. Small left pleural effusion cannot be excluded.
